# Patient Record
Sex: Female | Born: 1935 | Race: White | Hispanic: No | Smoking: Never smoker
Health system: Southern US, Community
[De-identification: ages and names within clinical notes are randomized; demographics above are authoritative.]

## PROBLEM LIST (undated history)

## (undated) DIAGNOSIS — I495 Sick sinus syndrome: Secondary | ICD-10-CM

## (undated) DIAGNOSIS — I712 Thoracic aortic aneurysm, without rupture: Secondary | ICD-10-CM

## (undated) DIAGNOSIS — I447 Left bundle-branch block, unspecified: Secondary | ICD-10-CM

## (undated) DIAGNOSIS — I351 Nonrheumatic aortic (valve) insufficiency: Secondary | ICD-10-CM

## (undated) DIAGNOSIS — H3530 Unspecified macular degeneration: Secondary | ICD-10-CM

## (undated) DIAGNOSIS — I10 Essential (primary) hypertension: Secondary | ICD-10-CM

## (undated) DIAGNOSIS — S72009A Fracture of unspecified part of neck of unspecified femur, initial encounter for closed fracture: Secondary | ICD-10-CM

## (undated) DIAGNOSIS — I480 Paroxysmal atrial fibrillation: Secondary | ICD-10-CM

## (undated) DIAGNOSIS — E785 Hyperlipidemia, unspecified: Secondary | ICD-10-CM

## (undated) DIAGNOSIS — I2510 Atherosclerotic heart disease of native coronary artery without angina pectoris: Secondary | ICD-10-CM

## (undated) DIAGNOSIS — I7121 Aneurysm of the ascending aorta, without rupture: Secondary | ICD-10-CM

## (undated) DIAGNOSIS — M1990 Unspecified osteoarthritis, unspecified site: Secondary | ICD-10-CM

## (undated) HISTORY — PX: ABDOMINAL HYSTERECTOMY: SHX81

## (undated) HISTORY — PX: APPENDECTOMY: SHX54

## (undated) HISTORY — DX: Paroxysmal atrial fibrillation: I48.0

## (undated) HISTORY — DX: Left bundle-branch block, unspecified: I44.7

## (undated) HISTORY — DX: Essential (primary) hypertension: I10

## (undated) HISTORY — DX: Unspecified macular degeneration: H35.30

## (undated) HISTORY — DX: Nonrheumatic aortic (valve) insufficiency: I35.1

## (undated) HISTORY — DX: Atherosclerotic heart disease of native coronary artery without angina pectoris: I25.10

## (undated) HISTORY — PX: FRACTURE SURGERY: SHX138

## (undated) HISTORY — PX: EYE SURGERY: SHX253

## (undated) HISTORY — DX: Hyperlipidemia, unspecified: E78.5

## (undated) HISTORY — DX: Aneurysm of the ascending aorta, without rupture: I71.21

## (undated) HISTORY — DX: Thoracic aortic aneurysm, without rupture: I71.2

## (undated) HISTORY — DX: Sick sinus syndrome: I49.5

## (undated) HISTORY — DX: Unspecified osteoarthritis, unspecified site: M19.90

## (undated) HISTORY — DX: Fracture of unspecified part of neck of unspecified femur, initial encounter for closed fracture: S72.009A

---

## 1992-11-11 HISTORY — PX: CORONARY ARTERY BYPASS GRAFT: SHX141

## 1997-05-21 ENCOUNTER — Other Ambulatory Visit: Admission: RE | Admit: 1997-05-21 | Discharge: 1997-05-21 | Payer: Self-pay | Admitting: Internal Medicine

## 2001-06-09 ENCOUNTER — Ambulatory Visit (HOSPITAL_COMMUNITY): Admission: RE | Admit: 2001-06-09 | Discharge: 2001-06-09 | Payer: Self-pay | Admitting: Internal Medicine

## 2001-06-09 ENCOUNTER — Encounter: Admission: RE | Admit: 2001-06-09 | Discharge: 2001-06-09 | Payer: Self-pay | Admitting: Internal Medicine

## 2001-06-09 ENCOUNTER — Encounter: Payer: Self-pay | Admitting: Internal Medicine

## 2001-06-28 ENCOUNTER — Encounter: Payer: Self-pay | Admitting: Otolaryngology

## 2001-06-29 ENCOUNTER — Ambulatory Visit (HOSPITAL_COMMUNITY): Admission: RE | Admit: 2001-06-29 | Discharge: 2001-06-29 | Payer: Self-pay | Admitting: Otolaryngology

## 2001-06-29 ENCOUNTER — Encounter: Payer: Self-pay | Admitting: Otolaryngology

## 2002-08-21 HISTORY — PX: CARDIOVASCULAR STRESS TEST: SHX262

## 2004-02-15 ENCOUNTER — Encounter: Admission: RE | Admit: 2004-02-15 | Discharge: 2004-02-15 | Payer: Self-pay | Admitting: Internal Medicine

## 2004-03-31 ENCOUNTER — Encounter: Admission: RE | Admit: 2004-03-31 | Discharge: 2004-03-31 | Payer: Self-pay | Admitting: Internal Medicine

## 2004-04-08 ENCOUNTER — Encounter: Admission: RE | Admit: 2004-04-08 | Discharge: 2004-04-08 | Payer: Self-pay | Admitting: Internal Medicine

## 2004-05-01 ENCOUNTER — Encounter: Admission: RE | Admit: 2004-05-01 | Discharge: 2004-05-01 | Payer: Self-pay | Admitting: Internal Medicine

## 2004-05-25 ENCOUNTER — Encounter: Admission: RE | Admit: 2004-05-25 | Discharge: 2004-05-25 | Payer: Self-pay | Admitting: Internal Medicine

## 2004-05-27 ENCOUNTER — Encounter: Admission: RE | Admit: 2004-05-27 | Discharge: 2004-05-27 | Payer: Self-pay | Admitting: Internal Medicine

## 2004-06-02 ENCOUNTER — Ambulatory Visit: Payer: Self-pay | Admitting: Pulmonary Disease

## 2004-08-21 ENCOUNTER — Encounter: Admission: RE | Admit: 2004-08-21 | Discharge: 2004-08-21 | Payer: Self-pay | Admitting: Internal Medicine

## 2004-12-23 ENCOUNTER — Encounter: Admission: RE | Admit: 2004-12-23 | Discharge: 2004-12-23 | Payer: Self-pay | Admitting: Internal Medicine

## 2006-06-09 HISTORY — PX: US ECHOCARDIOGRAPHY: HXRAD669

## 2006-12-21 ENCOUNTER — Encounter: Admission: RE | Admit: 2006-12-21 | Discharge: 2006-12-21 | Payer: Self-pay | Admitting: Internal Medicine

## 2007-07-14 HISTORY — PX: US ECHOCARDIOGRAPHY: HXRAD669

## 2008-03-12 ENCOUNTER — Ambulatory Visit: Payer: Self-pay | Admitting: Internal Medicine

## 2008-03-27 ENCOUNTER — Ambulatory Visit: Payer: Self-pay | Admitting: Internal Medicine

## 2008-05-14 ENCOUNTER — Ambulatory Visit: Payer: Self-pay | Admitting: Internal Medicine

## 2008-07-18 HISTORY — PX: US ECHOCARDIOGRAPHY: HXRAD669

## 2008-08-13 ENCOUNTER — Ambulatory Visit: Payer: Self-pay | Admitting: Internal Medicine

## 2008-09-25 ENCOUNTER — Ambulatory Visit: Payer: Self-pay | Admitting: Internal Medicine

## 2008-10-11 ENCOUNTER — Ambulatory Visit: Payer: Self-pay | Admitting: Internal Medicine

## 2008-12-24 ENCOUNTER — Ambulatory Visit: Payer: Self-pay | Admitting: Internal Medicine

## 2009-04-29 ENCOUNTER — Inpatient Hospital Stay (HOSPITAL_COMMUNITY): Admission: RE | Admit: 2009-04-29 | Discharge: 2009-05-08 | Payer: Self-pay | Admitting: Orthopedic Surgery

## 2009-05-02 ENCOUNTER — Encounter (INDEPENDENT_AMBULATORY_CARE_PROVIDER_SITE_OTHER): Payer: Self-pay | Admitting: Internal Medicine

## 2009-05-02 HISTORY — PX: TRANSTHORACIC ECHOCARDIOGRAM: SHX275

## 2009-06-11 ENCOUNTER — Ambulatory Visit: Payer: Self-pay | Admitting: Internal Medicine

## 2009-07-08 ENCOUNTER — Ambulatory Visit: Payer: Self-pay | Admitting: Internal Medicine

## 2009-07-08 ENCOUNTER — Encounter: Admission: RE | Admit: 2009-07-08 | Discharge: 2009-07-08 | Payer: Self-pay | Admitting: Internal Medicine

## 2009-07-22 HISTORY — PX: US ECHOCARDIOGRAPHY: HXRAD669

## 2009-08-16 ENCOUNTER — Inpatient Hospital Stay (HOSPITAL_COMMUNITY): Admission: AD | Admit: 2009-08-16 | Discharge: 2009-08-23 | Payer: Self-pay | Admitting: Internal Medicine

## 2009-08-16 ENCOUNTER — Ambulatory Visit: Payer: Self-pay | Admitting: Internal Medicine

## 2009-08-18 ENCOUNTER — Ambulatory Visit: Payer: Self-pay | Admitting: Cardiology

## 2009-08-18 ENCOUNTER — Encounter: Payer: Self-pay | Admitting: Internal Medicine

## 2009-08-18 HISTORY — PX: TRANSTHORACIC ECHOCARDIOGRAM: SHX275

## 2009-08-30 ENCOUNTER — Ambulatory Visit: Payer: Self-pay | Admitting: Internal Medicine

## 2009-09-05 ENCOUNTER — Ambulatory Visit: Payer: Self-pay | Admitting: Internal Medicine

## 2009-09-23 ENCOUNTER — Ambulatory Visit: Payer: Self-pay | Admitting: Internal Medicine

## 2009-12-13 ENCOUNTER — Ambulatory Visit: Payer: Self-pay | Admitting: Internal Medicine

## 2009-12-27 ENCOUNTER — Ambulatory Visit: Payer: Self-pay | Admitting: Oncology

## 2010-01-28 ENCOUNTER — Ambulatory Visit: Payer: Self-pay | Admitting: Cardiology

## 2010-02-02 ENCOUNTER — Encounter: Payer: Self-pay | Admitting: Internal Medicine

## 2010-02-07 ENCOUNTER — Ambulatory Visit: Payer: Self-pay | Admitting: Oncology

## 2010-02-11 LAB — CBC & DIFF AND RETIC
BASO%: 0.8 % (ref 0.0–2.0)
EOS%: 3.3 % (ref 0.0–7.0)
Immature Retic Fract: 1.9 % (ref 0.00–10.70)
MCH: 29.4 pg (ref 25.1–34.0)
MCHC: 31.4 g/dL — ABNORMAL LOW (ref 31.5–36.0)
MONO#: 0.4 10*3/uL (ref 0.1–0.9)
RBC: 3.71 10*6/uL (ref 3.70–5.45)
RDW: 13.4 % (ref 11.2–14.5)
Retic %: 0.6 % (ref 0.50–1.50)
Retic Ct Abs: 22.26 10*3/uL (ref 18.30–72.70)
WBC: 5.2 10*3/uL (ref 3.9–10.3)
lymph#: 1.1 10*3/uL (ref 0.9–3.3)

## 2010-02-11 LAB — MORPHOLOGY

## 2010-02-13 LAB — COMPREHENSIVE METABOLIC PANEL
ALT: 10 U/L (ref 0–35)
AST: 15 U/L (ref 0–37)
Alkaline Phosphatase: 59 U/L (ref 39–117)
BUN: 38 mg/dL — ABNORMAL HIGH (ref 6–23)
Calcium: 9.4 mg/dL (ref 8.4–10.5)
Creatinine, Ser: 0.92 mg/dL (ref 0.40–1.20)
Total Bilirubin: 0.4 mg/dL (ref 0.3–1.2)
Total Protein: 6.7 g/dL (ref 6.0–8.3)

## 2010-02-13 LAB — IMMUNOFIXATION ELECTROPHORESIS
IgA: 221 mg/dL (ref 68–378)
IgM, Serum: 111 mg/dL (ref 60–263)
Total Protein, Serum Electrophoresis: 6.7 g/dL (ref 6.0–8.3)

## 2010-02-13 LAB — TRANSFERRIN RECEPTOR, SOLUABLE: Transferrin Receptor, Soluble: 12.8 nmol/L

## 2010-02-27 ENCOUNTER — Encounter: Payer: Self-pay | Admitting: Internal Medicine

## 2010-02-27 DIAGNOSIS — J069 Acute upper respiratory infection, unspecified: Secondary | ICD-10-CM

## 2010-03-10 ENCOUNTER — Other Ambulatory Visit: Payer: Self-pay | Admitting: Internal Medicine

## 2010-03-10 ENCOUNTER — Ambulatory Visit (INDEPENDENT_AMBULATORY_CARE_PROVIDER_SITE_OTHER): Payer: Medicare Other | Admitting: Internal Medicine

## 2010-03-10 ENCOUNTER — Ambulatory Visit
Admission: RE | Admit: 2010-03-10 | Discharge: 2010-03-10 | Disposition: A | Discharge status: Home / Self Care | Payer: Medicare Other | Source: Ambulatory Visit | Attending: Internal Medicine | Admitting: Internal Medicine

## 2010-03-10 DIAGNOSIS — J189 Pneumonia, unspecified organism: Secondary | ICD-10-CM

## 2010-03-10 DIAGNOSIS — N390 Urinary tract infection, site not specified: Secondary | ICD-10-CM

## 2010-03-10 DIAGNOSIS — R6883 Chills (without fever): Secondary | ICD-10-CM

## 2010-03-10 DIAGNOSIS — D539 Nutritional anemia, unspecified: Secondary | ICD-10-CM

## 2010-03-21 ENCOUNTER — Ambulatory Visit (INDEPENDENT_AMBULATORY_CARE_PROVIDER_SITE_OTHER): Payer: Medicare Other | Admitting: Internal Medicine

## 2010-03-21 DIAGNOSIS — N390 Urinary tract infection, site not specified: Secondary | ICD-10-CM

## 2010-03-24 ENCOUNTER — Ambulatory Visit (INDEPENDENT_AMBULATORY_CARE_PROVIDER_SITE_OTHER): Payer: Medicare Other | Admitting: Internal Medicine

## 2010-03-24 ENCOUNTER — Other Ambulatory Visit: Payer: Self-pay | Admitting: Internal Medicine

## 2010-03-24 ENCOUNTER — Ambulatory Visit
Admission: RE | Admit: 2010-03-24 | Discharge: 2010-03-24 | Disposition: A | Discharge status: Home / Self Care | Payer: Medicare Other | Source: Ambulatory Visit | Attending: Internal Medicine | Admitting: Internal Medicine

## 2010-03-24 DIAGNOSIS — N390 Urinary tract infection, site not specified: Secondary | ICD-10-CM

## 2010-03-24 DIAGNOSIS — J189 Pneumonia, unspecified organism: Secondary | ICD-10-CM

## 2010-03-24 DIAGNOSIS — Z8701 Personal history of pneumonia (recurrent): Secondary | ICD-10-CM

## 2010-03-28 LAB — RETICULOCYTES
Retic Count, Absolute: 20.6 10*3/uL (ref 19.0–186.0)
Retic Ct Pct: 0.6 % (ref 0.4–3.1)

## 2010-03-28 LAB — URINALYSIS, ROUTINE W REFLEX MICROSCOPIC
Bilirubin Urine: NEGATIVE
Glucose, UA: NEGATIVE mg/dL
Nitrite: POSITIVE — AB
Specific Gravity, Urine: 1.014 (ref 1.005–1.030)
pH: 5.5 (ref 5.0–8.0)

## 2010-03-28 LAB — DIFFERENTIAL
Basophils Relative: 0 % (ref 0–1)
Eosinophils Absolute: 0.2 10*3/uL (ref 0.0–0.7)
Eosinophils Relative: 5 % (ref 0–5)
Lymphocytes Relative: 15 % (ref 12–46)
Lymphocytes Relative: 16 % (ref 12–46)
Lymphs Abs: 0.7 10*3/uL (ref 0.7–4.0)
Lymphs Abs: 0.9 10*3/uL (ref 0.7–4.0)
Monocytes Relative: 10 % (ref 3–12)
Monocytes Relative: 2 % — ABNORMAL LOW (ref 3–12)
Neutro Abs: 4.3 10*3/uL (ref 1.7–7.7)
Neutrophils Relative %: 79 % — ABNORMAL HIGH (ref 43–77)

## 2010-03-28 LAB — COMPREHENSIVE METABOLIC PANEL
Albumin: 3.7 g/dL (ref 3.5–5.2)
Alkaline Phosphatase: 78 U/L (ref 39–117)
BUN: 19 mg/dL (ref 6–23)
Calcium: 9.7 mg/dL (ref 8.4–10.5)
Creatinine, Ser: 0.82 mg/dL (ref 0.4–1.2)
Glucose, Bld: 116 mg/dL — ABNORMAL HIGH (ref 70–99)
Total Protein: 6.8 g/dL (ref 6.0–8.3)

## 2010-03-28 LAB — URINE CULTURE: Culture  Setup Time: 201108071220

## 2010-03-28 LAB — URINE MICROSCOPIC-ADD ON

## 2010-03-28 LAB — CBC
HCT: 29 % — ABNORMAL LOW (ref 36.0–46.0)
HCT: 31 % — ABNORMAL LOW (ref 36.0–46.0)
Hemoglobin: 9.7 g/dL — ABNORMAL LOW (ref 12.0–15.0)
MCH: 30.5 pg (ref 26.0–34.0)
MCHC: 33.1 g/dL (ref 30.0–36.0)
MCV: 90.7 fL (ref 78.0–100.0)
MCV: 92.2 fL (ref 78.0–100.0)
Platelets: 209 10*3/uL (ref 150–400)
Platelets: 262 10*3/uL (ref 150–400)
RBC: 3.19 MIL/uL — ABNORMAL LOW (ref 3.87–5.11)
RDW: 14.4 % (ref 11.5–15.5)
WBC: 5 10*3/uL (ref 4.0–10.5)

## 2010-03-28 LAB — FOLATE RBC: RBC Folate: 780 ng/mL — ABNORMAL HIGH (ref 180–600)

## 2010-03-28 LAB — BASIC METABOLIC PANEL
Creatinine, Ser: 0.65 mg/dL (ref 0.4–1.2)
Glucose, Bld: 105 mg/dL — ABNORMAL HIGH (ref 70–99)

## 2010-03-28 LAB — TSH: TSH: 2.331 u[IU]/mL (ref 0.350–4.500)

## 2010-03-28 LAB — IRON AND TIBC: Iron: 30 ug/dL — ABNORMAL LOW (ref 42–135)

## 2010-03-28 LAB — APTT: aPTT: 28 seconds (ref 24–37)

## 2010-03-28 LAB — PROTIME-INR: INR: 1.06 (ref 0.00–1.49)

## 2010-04-01 LAB — CBC
HCT: 26.7 % — ABNORMAL LOW (ref 36.0–46.0)
HCT: 28.3 % — ABNORMAL LOW (ref 36.0–46.0)
HCT: 36 % (ref 36.0–46.0)
Hemoglobin: 10 g/dL — ABNORMAL LOW (ref 12.0–15.0)
MCHC: 32.1 g/dL (ref 30.0–36.0)
MCHC: 32.2 g/dL (ref 30.0–36.0)
MCHC: 32.6 g/dL (ref 30.0–36.0)
MCHC: 32.7 g/dL (ref 30.0–36.0)
MCHC: 32.8 g/dL (ref 30.0–36.0)
MCHC: 32.8 g/dL (ref 30.0–36.0)
MCV: 92.9 fL (ref 78.0–100.0)
MCV: 93.1 fL (ref 78.0–100.0)
MCV: 93.6 fL (ref 78.0–100.0)
MCV: 93.9 fL (ref 78.0–100.0)
MCV: 94.1 fL (ref 78.0–100.0)
Platelets: 177 10*3/uL (ref 150–400)
Platelets: 182 10*3/uL (ref 150–400)
Platelets: 193 10*3/uL (ref 150–400)
Platelets: 225 10*3/uL (ref 150–400)
Platelets: 263 10*3/uL (ref 150–400)
RBC: 2.71 MIL/uL — ABNORMAL LOW (ref 3.87–5.11)
RBC: 2.82 MIL/uL — ABNORMAL LOW (ref 3.87–5.11)
RBC: 2.96 MIL/uL — ABNORMAL LOW (ref 3.87–5.11)
RBC: 3.3 MIL/uL — ABNORMAL LOW (ref 3.87–5.11)
RDW: 13.5 % (ref 11.5–15.5)
RDW: 13.7 % (ref 11.5–15.5)
RDW: 13.9 % (ref 11.5–15.5)
WBC: 11.8 10*3/uL — ABNORMAL HIGH (ref 4.0–10.5)
WBC: 5.8 10*3/uL (ref 4.0–10.5)
WBC: 7.3 10*3/uL (ref 4.0–10.5)

## 2010-04-01 LAB — URINE MICROSCOPIC-ADD ON

## 2010-04-01 LAB — BASIC METABOLIC PANEL
BUN: 22 mg/dL (ref 6–23)
BUN: 22 mg/dL (ref 6–23)
BUN: 23 mg/dL (ref 6–23)
BUN: 26 mg/dL — ABNORMAL HIGH (ref 6–23)
CO2: 25 mEq/L (ref 19–32)
CO2: 29 mEq/L (ref 19–32)
CO2: 29 mEq/L (ref 19–32)
CO2: 32 mEq/L (ref 19–32)
Calcium: 8.1 mg/dL — ABNORMAL LOW (ref 8.4–10.5)
Calcium: 8.2 mg/dL — ABNORMAL LOW (ref 8.4–10.5)
Calcium: 8.4 mg/dL (ref 8.4–10.5)
Chloride: 100 mEq/L (ref 96–112)
Chloride: 103 mEq/L (ref 96–112)
Chloride: 106 mEq/L (ref 96–112)
Creatinine, Ser: 0.77 mg/dL (ref 0.4–1.2)
Creatinine, Ser: 0.77 mg/dL (ref 0.4–1.2)
GFR calc Af Amer: >60 mL/min (ref >60)
GFR calc Af Amer: >60 mL/min (ref >60)
GFR calc Af Amer: >60 mL/min (ref >60)
GFR calc non Af Amer: >60 mL/min (ref >60)
GFR calc non Af Amer: >60 mL/min (ref >60)
GFR calc non Af Amer: >60 mL/min (ref >60)
Glucose, Bld: 83 mg/dL (ref 70–99)
Glucose, Bld: 92 mg/dL (ref 70–99)
Glucose, Bld: 99 mg/dL (ref 70–99)
Potassium: 3.4 mEq/L — ABNORMAL LOW (ref 3.5–5.1)
Potassium: 3.6 mEq/L (ref 3.5–5.1)
Sodium: 138 mEq/L (ref 135–145)

## 2010-04-01 LAB — CARDIAC PANEL(CRET KIN+CKTOT+MB+TROPI)
CK, MB: 2.3 ng/mL (ref 0.3–4.0)
CK, MB: 4.7 ng/mL — ABNORMAL HIGH (ref 0.3–4.0)
Relative Index: 1.5 (ref 0.0–2.5)
Relative Index: 3.1 — ABNORMAL HIGH (ref 0.0–2.5)
Total CK: 130 U/L (ref 7–177)
Total CK: 150 U/L (ref 7–177)
Troponin I: 0.19 ng/mL — ABNORMAL HIGH (ref 0.00–0.06)

## 2010-04-01 LAB — URINALYSIS, MICROSCOPIC ONLY
Bilirubin Urine: NEGATIVE
Glucose, UA: NEGATIVE mg/dL
Ketones, ur: NEGATIVE mg/dL
pH: 5 (ref 5.0–8.0)

## 2010-04-01 LAB — URINALYSIS, ROUTINE W REFLEX MICROSCOPIC
Leukocytes, UA: NEGATIVE
Nitrite: NEGATIVE
Specific Gravity, Urine: 1.013 (ref 1.005–1.030)
Urobilinogen, UA: 0.2 mg/dL (ref 0.0–1.0)
pH: 5 (ref 5.0–8.0)

## 2010-04-01 LAB — BLOOD GAS, ARTERIAL
Bicarbonate: 29.8 mEq/L — ABNORMAL HIGH (ref 20.0–24.0)
O2 Saturation: 96.8 %
Patient temperature: 97.9
TCO2: 28.3 mmol/L (ref 0–100)
pH, Arterial: 7.361 (ref 7.350–7.400)

## 2010-04-01 LAB — APTT: aPTT: 32 seconds (ref 24–37)

## 2010-04-01 LAB — COMPREHENSIVE METABOLIC PANEL
ALT: 12 U/L (ref 0–35)
AST: 20 U/L (ref 0–37)
Albumin: 3 g/dL — ABNORMAL LOW (ref 3.5–5.2)
BUN: 34 mg/dL — ABNORMAL HIGH (ref 6–23)
CO2: 28 mEq/L (ref 19–32)
CO2: 28 mEq/L (ref 19–32)
Calcium: 8.5 mg/dL (ref 8.4–10.5)
Calcium: 8.8 mg/dL (ref 8.4–10.5)
Chloride: 105 mEq/L (ref 96–112)
Creatinine, Ser: 0.98 mg/dL (ref 0.4–1.2)
GFR calc Af Amer: >60 mL/min (ref >60)
GFR calc Af Amer: >60 mL/min (ref >60)
GFR calc non Af Amer: 53 mL/min — ABNORMAL LOW (ref >60)
GFR calc non Af Amer: >60 mL/min (ref >60)
Sodium: 141 mEq/L (ref 135–145)
Total Bilirubin: 1.1 mg/dL (ref 0.3–1.2)

## 2010-04-01 LAB — CROSSMATCH: ABO/RH(D): A POS

## 2010-04-01 LAB — PROTIME-INR: INR: 1.12 (ref 0.00–1.49)

## 2010-04-01 LAB — D-DIMER, QUANTITATIVE: D-Dimer, Quant: 2 ug/mL-FEU — ABNORMAL HIGH (ref 0.00–0.48)

## 2010-04-01 LAB — BRAIN NATRIURETIC PEPTIDE: Pro B Natriuretic peptide (BNP): 502 pg/mL — ABNORMAL HIGH (ref 0.0–100.0)

## 2010-04-01 LAB — URINE CULTURE

## 2010-04-07 ENCOUNTER — Ambulatory Visit (INDEPENDENT_AMBULATORY_CARE_PROVIDER_SITE_OTHER): Payer: Medicare Other | Admitting: Internal Medicine

## 2010-04-07 ENCOUNTER — Ambulatory Visit
Admission: RE | Admit: 2010-04-07 | Discharge: 2010-04-07 | Disposition: A | Discharge status: Home / Self Care | Payer: Medicare Other | Source: Ambulatory Visit | Attending: Internal Medicine | Admitting: Internal Medicine

## 2010-04-07 ENCOUNTER — Other Ambulatory Visit: Payer: Self-pay | Admitting: Internal Medicine

## 2010-04-07 DIAGNOSIS — J189 Pneumonia, unspecified organism: Secondary | ICD-10-CM

## 2010-04-07 DIAGNOSIS — N390 Urinary tract infection, site not specified: Secondary | ICD-10-CM

## 2010-06-11 ENCOUNTER — Other Ambulatory Visit: Payer: Self-pay

## 2010-06-11 MED ORDER — ALPRAZOLAM 0.5 MG PO TABS: 0.5000 mg | ORAL_TABLET | Freq: Two times a day (BID) | ORAL | Status: DC

## 2010-06-13 HISTORY — PX: PACEMAKER INSERTION: SHX728

## 2010-06-17 ENCOUNTER — Encounter: Payer: Self-pay | Admitting: Internal Medicine

## 2010-06-17 ENCOUNTER — Ambulatory Visit (INDEPENDENT_AMBULATORY_CARE_PROVIDER_SITE_OTHER): Payer: Medicare Other | Admitting: Internal Medicine

## 2010-06-17 VITALS — BP 166/72 | HR 84 | Temp 97.7°F | Ht 61.5 in | Wt 102.5 lb

## 2010-06-17 DIAGNOSIS — C44320 Squamous cell carcinoma of skin of unspecified parts of face: Secondary | ICD-10-CM

## 2010-06-17 DIAGNOSIS — M1990 Unspecified osteoarthritis, unspecified site: Secondary | ICD-10-CM

## 2010-06-17 DIAGNOSIS — I2510 Atherosclerotic heart disease of native coronary artery without angina pectoris: Secondary | ICD-10-CM

## 2010-06-17 DIAGNOSIS — I259 Chronic ischemic heart disease, unspecified: Secondary | ICD-10-CM

## 2010-06-17 DIAGNOSIS — I712 Thoracic aortic aneurysm, without rupture: Secondary | ICD-10-CM

## 2010-06-17 DIAGNOSIS — E785 Hyperlipidemia, unspecified: Secondary | ICD-10-CM

## 2010-06-17 DIAGNOSIS — R5381 Other malaise: Secondary | ICD-10-CM

## 2010-06-17 DIAGNOSIS — D509 Iron deficiency anemia, unspecified: Secondary | ICD-10-CM

## 2010-06-17 DIAGNOSIS — H3530 Unspecified macular degeneration: Secondary | ICD-10-CM | POA: Insufficient documentation

## 2010-06-17 DIAGNOSIS — I351 Nonrheumatic aortic (valve) insufficiency: Secondary | ICD-10-CM

## 2010-06-17 DIAGNOSIS — N390 Urinary tract infection, site not specified: Secondary | ICD-10-CM

## 2010-06-17 DIAGNOSIS — M810 Age-related osteoporosis without current pathological fracture: Secondary | ICD-10-CM | POA: Insufficient documentation

## 2010-06-17 DIAGNOSIS — E559 Vitamin D deficiency, unspecified: Secondary | ICD-10-CM

## 2010-06-17 DIAGNOSIS — D649 Anemia, unspecified: Secondary | ICD-10-CM

## 2010-06-17 DIAGNOSIS — I10 Essential (primary) hypertension: Secondary | ICD-10-CM

## 2010-06-17 DIAGNOSIS — H7100 Cholesteatoma of attic, unspecified ear: Secondary | ICD-10-CM

## 2010-06-17 DIAGNOSIS — Z0000 Encounter for general adult medical examination without abnormal findings: Secondary | ICD-10-CM

## 2010-06-17 DIAGNOSIS — I359 Nonrheumatic aortic valve disorder, unspecified: Secondary | ICD-10-CM

## 2010-06-17 DIAGNOSIS — H7101 Cholesteatoma of attic, right ear: Secondary | ICD-10-CM

## 2010-06-17 LAB — POCT URINALYSIS DIPSTICK
Bilirubin, UA: NEGATIVE
Glucose, UA: NEGATIVE
Nitrite, UA: POSITIVE
pH, UA: 5

## 2010-06-17 LAB — CBC WITH DIFFERENTIAL/PLATELET
Basophils Absolute: 0 10*3/uL (ref 0.0–0.1)
Basophils Relative: 0 % (ref 0–1)
Hemoglobin: 10.2 g/dL — ABNORMAL LOW (ref 12.0–15.0)
MCHC: 31 g/dL (ref 30.0–36.0)
Monocytes Relative: 5 % (ref 3–12)
Neutro Abs: 3.5 10*3/uL (ref 1.7–7.7)
Neutrophils Relative %: 68 % (ref 43–77)
RDW: 14.7 % (ref 11.5–15.5)

## 2010-06-17 NOTE — Patient Instructions (Signed)
Take medications as directed. Your urine has been cultured and results are pending. Increase Vicodin to twice a day instead of daily for pain control. See [place] again in 6 months for fasting lab work and office visit.

## 2010-06-17 NOTE — Progress Notes (Signed)
  Subjective:    Patient ID: Felicia [name], female    DOB: Nov 29, 1912, 77 y.o.   MRN: [id number]  HPI patient in today for evaluation of multiple medical problems including coronary artery disease status post PTCA  1994 with a 95% LAD lesion and an 80% segmental lesion. History of hypertension, hyperlipidemia, osteoarthritis, osteoporosis, macular degeneration righ teye recurrent urinary tract infections, moderate aortic insufficiency, vitamin D deficiency, cholesteatoma right ear, chronic anemia, ascending aortic thoracic aneurysm not amenable to surgery, status post left hip hemi arthroplasty. Patient had pneumonia in February 2020 [place]. 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 Also followed by Dr. Lenoria Farrier in [facility], otolaryngologist, and Dr. Patty Sermons, cardiologist. Patient had Pneumovax immunization in 2001 gets yearly influenza immunization, last tetanus immunization 1995. Last mammogram 2004. Last Pap smear 2001.    Review of Systems chronic back and leg pain, difficulty with vision particularly right eye, ambulates with a walker. Lives alone in [place] . Family lives nearby. Does not smoke or drink alcohol. Does not  dip snuff. She is a widow. Homemaker. Has  visiting nurse check all her every 3 months and someone from [employer] to do light housework one or 2 days a week. Recent workup for anemia lab hematologist January 2012. It was decided not to be aggressive. Could be anemia of chronic disease. Hematologist did not feel we needed to pursue colonoscopy. Hemoglobin has been stable. Appetite fair. Hospitalized August 2011 with acute L1 compression fracture. Sulfa has caused nausea in the past. Patient indicated in the past that hydrocodone caused this but she is able to tolerate Vicodin for musculoskeletal pain just fine.     Objective:   Physical Exam neck supple, no JVD, no thyromegaly,. Chest clear to auscultation. Cardiac exam regular rate and rhythm normal S1  and S2. Extremities without edema. Neuro: She is alert and oriented to person place and time. Ambulates well with a walker.        Assessment & Plan:  1-hypertension stable on current regimen  2-coronary artery disease stable   3-descending aortic aneurysm-not amenable to surgery at her age and she understands that he is  4-recurrent urinary tract infections-urinalysis today abnormal and urine culture obtained  5 osteoarthritis treated with Vicodin 5/500 daily. Increase to 1 tablet twice daily.  6-moderate aortic insufficiency-stable  7-Vitamin D deficiency-treated with over-the-counter vitamin D supplement   8-Cholesteatoma right ear-patient has appointment to see Dr. Lenoria Farrier soon  9-anemia likely of chronic disease worked up by hematologist January 2000 [place]. CBC drawn and is pending today   10 -- hyperlipidemia currently on lovastatin 20 mg daily fasting lipid panel liver functions drawn today.  Plan patient is doing exceptionally well for her age. It is remarkable she continues to live a line. Return in 6 months for brief checkup, lipid panel liver functions and CBC , and basic metabolic panel.

## 2010-06-18 LAB — LIPID PANEL
HDL: 51 mg/dL (ref >39)
LDL Cholesterol: 96 mg/dL (ref 0–99)
Triglycerides: 80 mg/dL (ref <150)
VLDL: 16 mg/dL (ref 0–40)

## 2010-06-18 LAB — COMPREHENSIVE METABOLIC PANEL
ALT: 8 U/L (ref 0–35)
AST: 14 U/L (ref 0–37)
Albumin: 4.1 g/dL (ref 3.5–5.2)
Alkaline Phosphatase: 44 U/L (ref 39–117)
Potassium: 5.2 mEq/L (ref 3.5–5.3)
Sodium: 138 mEq/L (ref 135–145)
Total Protein: 6.9 g/dL (ref 6.0–8.3)

## 2010-06-18 LAB — TSH: TSH: 1.786 u[IU]/mL (ref 0.350–4.500)

## 2010-06-20 ENCOUNTER — Other Ambulatory Visit: Payer: Self-pay

## 2010-06-20 ENCOUNTER — Encounter: Payer: Self-pay | Admitting: Cardiology

## 2010-06-20 ENCOUNTER — Telehealth: Payer: Self-pay

## 2010-06-20 LAB — URINE CULTURE

## 2010-06-20 MED ORDER — CIPROFLOXACIN HCL 250 MG PO TABS: 250.0000 mg | ORAL_TABLET | Freq: Two times a day (BID) | ORAL | Status: AC

## 2010-06-20 NOTE — Telephone Encounter (Signed)
Phone call from [facility] nurse manager Verlon Au, at patients home today. Patient states she has been having chest discomfort since yesterday. Happens mostly on inspiration.Burping a lot. Advised nurse she should be evaluated at ED, or at least an UrgentCare. Also patient has UTI on Culture results. Per Dr. Lenord Fellers, Cipro 250 mg 1 po bid x 10 days sent to [facility] in [facility] [address] [facility]

## 2010-06-27 ENCOUNTER — Telehealth: Payer: Self-pay | Admitting: Cardiology

## 2010-06-27 NOTE — Telephone Encounter (Signed)
Spoke with Felicia [name] and advised patient needed see doctor who put in pacemaker per Dr. Patty Sermons.  She will and call back next week to schedule appointment

## 2010-06-27 NOTE — Telephone Encounter (Signed)
Patient is due to see [place] in July.  She has recently had a pacer implant and was told to see her doctor within a week or so.  Patient is also experiencing swelling in one of her hands and would like to speak with RN about possibly getting worked in.

## 2010-06-30 ENCOUNTER — Other Ambulatory Visit: Payer: Self-pay | Admitting: Internal Medicine

## 2010-06-30 DIAGNOSIS — I2510 Atherosclerotic heart disease of native coronary artery without angina pectoris: Secondary | ICD-10-CM

## 2010-07-07 ENCOUNTER — Other Ambulatory Visit: Payer: Self-pay | Admitting: Internal Medicine

## 2010-07-07 DIAGNOSIS — E785 Hyperlipidemia, unspecified: Secondary | ICD-10-CM

## 2010-07-28 ENCOUNTER — Ambulatory Visit (INDEPENDENT_AMBULATORY_CARE_PROVIDER_SITE_OTHER): Payer: Medicare Other | Admitting: Cardiology

## 2010-07-28 ENCOUNTER — Encounter: Payer: Self-pay | Admitting: Cardiology

## 2010-07-28 DIAGNOSIS — Z950 Presence of cardiac pacemaker: Secondary | ICD-10-CM | POA: Insufficient documentation

## 2010-07-28 DIAGNOSIS — I359 Nonrheumatic aortic valve disorder, unspecified: Secondary | ICD-10-CM

## 2010-07-28 DIAGNOSIS — I351 Nonrheumatic aortic (valve) insufficiency: Secondary | ICD-10-CM

## 2010-07-28 NOTE — Assessment & Plan Note (Signed)
The patient has a past history of known left bundle-branch block.  Her last EKG here was 01/24/10 which showed normal sinus rhythm with a left bundle-branch block pattern.  The patient was doing well until June 23, 2010 When she was airlifted by helicopter from her home to [facility] because of chest pain.  Several days later on 06/26/10 she underwent insertion of a cardiac pacemaker.  We don't have any of the details of that admission or what the underlying rhythm was at the time of the pacemaker implantation.  She is known pre-existing left bundle-branch block.  She states that her symptoms were chest tightness and discomfort rather than syncope.  We don't have the records from [facility] at this point to learning more about her situation.  She has felt much better since the pacemaker.  She's not having any chest pain.

## 2010-07-28 NOTE — Assessment & Plan Note (Signed)
The patient has a past history of known aortic valve disease with aortic insufficiency.  She has not had any exacerbation of congestive heart failure.  She's not having any increasing dyspnea and her previous symptoms of chest pain have resolved with the placement of the permanent pacemaker.

## 2010-07-28 NOTE — Progress Notes (Signed)
Felicia [name] Date of Birth:  July 10, 1912 [facility] [address]   [address] [place], [place]  [address] [phone]           Fax   [phone]  HPI: This pleasant 89 showing is seen for a six-month followup office visit.  She had a history of known chronic left bundle-branch block.  She has a history of ischemic heart disease and has had prior PTCA to the LAD in 1994.  She's had an ascending aortic aneurysm first noted in 2003 and elected not to have surgery.  She has a known murmur of aortic insufficiency.  In addition she has a history of essential hypertension, hypercholesterolemia, vertigo, and arthritis.  She had a hip fracture in 2001.  On June 11 she developed chest discomfort and was airlifted by helicopter to [facility] where 3 days later on June 14 she underwent placement of a pacemaker.  She denies any history of dizziness or syncope.  Since the pacemaker she has a felt better overall and her stamina has improved.  Her last nuclear stress test was in 2004 and her last echocardiogram was 08/18/09 at which time she had normal ejection fraction of 60-65% with moderate LVH and with diastolic dysfunction grade 1.  Her aortic valve at that time showed mild to moderate regurgitation and the aorta itself was dilated with the proximal aorta measuring 5.38 cm.  She did have pulmonary artery hypertension with a PA pressure of 54.  Current Outpatient Prescriptions  Medication Sig Dispense Refill  . ALPRAZolam (XANAX) 0.5 MG tablet Take 1 tablet (0.5 mg total) by mouth 2 (two) times daily. 1/2 to 1 tablet bid  180 tablet  5  . aspirin 325 MG tablet Take 81 mg by mouth daily.       Marland Kitchen HYDROcodone-acetaminophen (VICODIN) 5-500 MG per tablet Take 1 tablet by mouth every 6 (six) hours as needed.        . isosorbide mononitrate (IMDUR) 60 MG 24 hr tablet TAKE (1/2) TABLET DAILY.  45 tablet  3  . lisinopril (PRINIVIL,ZESTRIL) 10 MG tablet Take 10 mg by mouth daily.          Marland Kitchen lovastatin (MEVACOR) 20 MG tablet TAKE 1 TABLET ONCE DAILY  90 tablet  3  . metoprolol (LOPRESSOR) 50 MG tablet Take 50 mg by mouth daily.        . Multiple Vitamin (MULTIVITAMIN) capsule Take 1 capsule by mouth daily.        . Multiple Vitamins-Minerals (OCUVITE ADULT 50+ PO) Take by mouth 2 (two) times daily at 10 AM and 5 PM.          Allergies  Allergen Reactions  . Hydrocodone Hives  . Sulfa Antibiotics Nausea Only    Patient Active Problem List  Diagnoses  . HTN (hypertension)  . Coronary disease  . Other and unspecified hyperlipidemia  . Cholesteatoma of attic of right ear  . Frequent UTI  . Macular degeneration  . Osteoporosis  . Osteoarthritis  . Aortic insufficiency  . Squamous cell carcinoma of face  . Anemia  . Aneurysm of ascending aorta  . Cardiac pacemaker    History  Smoking status  . Never Smoker   Smokeless tobacco  . Never Used    History  Alcohol Use No    No family history on file.  Review of Systems: The patient denies any heat or cold intolerance.  No weight gain or weight loss.  The patient  denies headaches or blurry vision.  There is no cough or sputum production.  The patient denies dizziness.  There is no hematuria or hematochezia.  The patient denies any muscle aches or arthritis.  The patient denies any rash.  The patient denies frequent falling or instability.  There is no history of depression or anxiety.  All other systems were reviewed and are negative.   Physical Exam: Filed Vitals:   07/28/10 1522  BP: 140/60  Pulse: 70  The general appearance feels an elderly woman in no acute distress.  She appears to be slightly pale.  The skin is warm and dry.Pupils equal and reactive.   Extraocular Movements are full.  There is no scleral icterus.  The mouth and pharynx are normal.  The neck is supple.  The carotids reveal no bruits.  The jugular venous pressure is normal.  The thyroid is not enlarged.  There is no lymphadenopathy.The  chest is clear to percussion and auscultation. There are no rales or rhonchi. Expansion of the chest is symmetrical.  The pacemaker site looks fine.  The heart reveals a soft murmur of aortic insufficiency along the sternal edge.  There is no gallop.The abdomen is soft and nontender. Bowel sounds are normal. The liver and spleen are not enlarged. There Are no abdominal masses. There are no bruits.  Extremities reveal mild 1+ left ankle edema which is secondary to her left hip fracture a year ago.  Strength is normal and symmetrical in all extremities.  There is no lateralizing weakness.  There are no sensory deficits.The skin is warm and dry.  There is no rash.She does have some old ecchymosis of the left arm at the site of the previous intravenous fluid administration    Assessment / Plan:  The patient is to continue same medication and be rechecked in 6 months for a followup office visit and EKG.  She reports that her pacemaker will be followed down at [facility].

## 2010-08-21 ENCOUNTER — Other Ambulatory Visit: Payer: Self-pay | Admitting: *Deleted

## 2010-08-21 MED ORDER — METOPROLOL TARTRATE 25 MG PO TABS: ORAL_TABLET | ORAL | Status: DC

## 2010-08-28 ENCOUNTER — Telehealth: Payer: Self-pay | Admitting: Internal Medicine

## 2010-08-28 NOTE — Telephone Encounter (Signed)
Pt advised she needed to be seen.  Scheduled for an appt Friday, Aug 17 at 9:45

## 2010-08-29 ENCOUNTER — Ambulatory Visit (INDEPENDENT_AMBULATORY_CARE_PROVIDER_SITE_OTHER): Payer: Medicare Other | Admitting: Internal Medicine

## 2010-08-29 ENCOUNTER — Encounter: Payer: Self-pay | Admitting: Internal Medicine

## 2010-08-29 VITALS — BP 150/76 | Temp 97.6°F | Wt 100.0 lb

## 2010-08-29 DIAGNOSIS — N390 Urinary tract infection, site not specified: Secondary | ICD-10-CM

## 2010-08-29 DIAGNOSIS — D649 Anemia, unspecified: Secondary | ICD-10-CM

## 2010-08-29 DIAGNOSIS — R300 Dysuria: Secondary | ICD-10-CM

## 2010-08-29 LAB — POCT URINALYSIS DIPSTICK
Bilirubin, UA: NEGATIVE
Ketones, UA: NEGATIVE
Protein, UA: NEGATIVE
Spec Grav, UA: 1.02
pH, UA: 5

## 2010-08-29 MED ORDER — CEFTRIAXONE SODIUM 1 G IJ SOLR
1.0000 g | INTRAMUSCULAR | Status: AC
  Administered 2010-08-29: 1 g via INTRAMUSCULAR

## 2010-08-29 NOTE — Patient Instructions (Signed)
Take Macrobid 50 mg twice daily for 10 days. Return in 2 weeks.

## 2010-08-29 NOTE — Progress Notes (Signed)
Addended by: Judy Pimple on: 08/29/2010 11:52 AM   Modules accepted: Orders

## 2010-08-29 NOTE — Progress Notes (Signed)
  Subjective:    Patient ID: Felicia [name], female    DOB: Dec 21, 1912, 77 y.o.   MRN: [id number]  HPI  77 year old white female with history of hypertension, hyperlipidemia, coronary artery disease, tachybradycardia syndrome status post pacemaker insertion, macular degeneration, cholesteatoma right ear, aortic insufficiency in today with urinary tract infection symptoms for several days. No fever or shaking chills. History of osteoporosis with compression fractures. History of resistance to Levaquin/ Cipro with urine infections.     Review of Systems     Objective:   Physical Exam no CVA tenderness. Chest clear to auscultation; cardiac exam regular rate and rhythm normal S1 and S2; extremities without edema; alert and oriented x3        Assessment & Plan:  Urinary tract infection-abnormal urinalysis. Culture is pending. Plan Macrobid 50 mg twice daily for 10 days with plans to recheck urine in 2 weeks. 1 g IM Rocephin given.

## 2010-08-30 LAB — CBC WITH DIFFERENTIAL/PLATELET
Basophils Absolute: 0 10*3/uL (ref 0.0–0.1)
Basophils Relative: 1 % (ref 0–1)
Hemoglobin: 9.8 g/dL — ABNORMAL LOW (ref 12.0–15.0)
MCHC: 29.5 g/dL — ABNORMAL LOW (ref 30.0–36.0)
Neutro Abs: 3.4 10*3/uL (ref 1.7–7.7)
Neutrophils Relative %: 66 % (ref 43–77)
Platelets: 206 10*3/uL (ref 150–400)
RDW: 14.5 % (ref 11.5–15.5)

## 2010-08-31 LAB — URINE CULTURE: Colony Count: >=100000

## 2010-09-04 ENCOUNTER — Other Ambulatory Visit: Payer: Self-pay | Admitting: *Deleted

## 2010-09-04 MED ORDER — LISINOPRIL 20 MG PO TABS: 20.0000 mg | ORAL_TABLET | Freq: Every day | ORAL | Status: DC

## 2010-09-12 ENCOUNTER — Encounter: Payer: Self-pay | Admitting: Internal Medicine

## 2010-09-12 ENCOUNTER — Ambulatory Visit (INDEPENDENT_AMBULATORY_CARE_PROVIDER_SITE_OTHER): Payer: Medicare Other | Admitting: Internal Medicine

## 2010-09-12 VITALS — Temp 98.2°F

## 2010-09-12 DIAGNOSIS — N390 Urinary tract infection, site not specified: Secondary | ICD-10-CM

## 2010-09-12 LAB — POCT URINALYSIS DIPSTICK
Protein, UA: NEGATIVE
Urobilinogen, UA: NEGATIVE
pH, UA: 6

## 2010-09-12 NOTE — Progress Notes (Signed)
  Subjective:    Patient ID: Felicia [name], female    DOB: 12-13-1912, 77 y.o.   MRN: [id number]  HPI    Review of Systems     Objective:   Physical Exam        Assessment & Plan:

## 2010-09-12 NOTE — Progress Notes (Signed)
  Subjective:    Patient ID: Felicia [name], female    DOB: Jan 19, 1912, 77 y.o.   MRN: [id number]  HPI patient in today to followup on recent treatment for urinary tract infection. She was here 08/29/2010 complaining of UTI symptoms. Was placed on Macrobid 50 mg twice daily for 10 days and was given 1 g IM Rocephin. Culture grew greater than 100,000 multiple species. Patient now feeling better and is asymptomatic. Has trace LE on urinalysis today.    Review of Systems     Objective:   Physical Exam        Assessment & Plan:  Status post treatment for UTI. Patient now asymptomatic. Has trace LE on urinalysis. Will not initiate any further treatment at this point in time. Will not reculture urine today.

## 2010-09-30 ENCOUNTER — Encounter (INDEPENDENT_AMBULATORY_CARE_PROVIDER_SITE_OTHER): Payer: Medicare Other | Admitting: Ophthalmology

## 2010-10-03 ENCOUNTER — Telehealth: Payer: Self-pay | Admitting: Internal Medicine

## 2010-10-03 NOTE — Telephone Encounter (Signed)
Called Felicia [name] Thursday, 10/02/10 in the afternoon.  Pt stated she had a cold that seemed to be going into her chest.  Advised her that her call had been received at Dr. Beryle Quant home and to please direct all calls to the office in the future.  Advised patient that Dr. Lenord Fellers was not in the office and to go to Urgent Care or schedule an appt for Friday.  Patient stated she would try to manage symptoms on her own and call back if not any better.

## 2010-10-15 ENCOUNTER — Encounter (INDEPENDENT_AMBULATORY_CARE_PROVIDER_SITE_OTHER): Payer: Medicare Other | Admitting: Ophthalmology

## 2010-10-15 DIAGNOSIS — H2700 Aphakia, unspecified eye: Secondary | ICD-10-CM

## 2010-10-15 DIAGNOSIS — H35039 Hypertensive retinopathy, unspecified eye: Secondary | ICD-10-CM

## 2010-10-15 DIAGNOSIS — H43819 Vitreous degeneration, unspecified eye: Secondary | ICD-10-CM

## 2010-10-15 DIAGNOSIS — H3530 Unspecified macular degeneration: Secondary | ICD-10-CM

## 2010-12-18 ENCOUNTER — Encounter: Payer: Self-pay | Admitting: Internal Medicine

## 2010-12-18 ENCOUNTER — Ambulatory Visit (INDEPENDENT_AMBULATORY_CARE_PROVIDER_SITE_OTHER): Payer: Medicare Other | Admitting: Internal Medicine

## 2010-12-18 DIAGNOSIS — I712 Thoracic aortic aneurysm, without rupture, unspecified: Secondary | ICD-10-CM

## 2010-12-18 DIAGNOSIS — D649 Anemia, unspecified: Secondary | ICD-10-CM

## 2010-12-18 DIAGNOSIS — M545 Low back pain, unspecified: Secondary | ICD-10-CM

## 2010-12-18 DIAGNOSIS — I10 Essential (primary) hypertension: Secondary | ICD-10-CM

## 2010-12-18 DIAGNOSIS — Z79899 Other long term (current) drug therapy: Secondary | ICD-10-CM

## 2010-12-18 DIAGNOSIS — N390 Urinary tract infection, site not specified: Secondary | ICD-10-CM

## 2010-12-18 DIAGNOSIS — E785 Hyperlipidemia, unspecified: Secondary | ICD-10-CM

## 2010-12-18 DIAGNOSIS — Z8781 Personal history of (healed) traumatic fracture: Secondary | ICD-10-CM

## 2010-12-18 DIAGNOSIS — R300 Dysuria: Secondary | ICD-10-CM

## 2010-12-18 LAB — POCT URINALYSIS DIPSTICK
Nitrite, UA: POSITIVE
pH, UA: 6

## 2010-12-19 LAB — CBC WITH DIFFERENTIAL/PLATELET
Basophils Absolute: 0 10*3/uL (ref 0.0–0.1)
Eosinophils Relative: 2 % (ref 0–5)
HCT: 34.5 % — ABNORMAL LOW (ref 36.0–46.0)
Hemoglobin: 10.4 g/dL — ABNORMAL LOW (ref 12.0–15.0)
Lymphocytes Relative: 27 % (ref 12–46)
Lymphs Abs: 1.5 10*3/uL (ref 0.7–4.0)
MCV: 94 fL (ref 78.0–100.0)
Monocytes Absolute: 0.3 10*3/uL (ref 0.1–1.0)
Monocytes Relative: 6 % (ref 3–12)
Neutro Abs: 3.5 10*3/uL (ref 1.7–7.7)
RDW: 14.7 % (ref 11.5–15.5)
WBC: 5.5 10*3/uL (ref 4.0–10.5)

## 2010-12-19 LAB — HEPATIC FUNCTION PANEL
AST: 16 U/L (ref 0–37)
Albumin: 3.8 g/dL (ref 3.5–5.2)
Bilirubin, Direct: 0.2 mg/dL (ref 0.0–0.3)
Total Bilirubin: 0.6 mg/dL (ref 0.3–1.2)

## 2010-12-19 LAB — LIPID PANEL
LDL Cholesterol: 95 mg/dL (ref 0–99)
Total CHOL/HDL Ratio: 2.8 Ratio

## 2010-12-21 LAB — URINE CULTURE: Colony Count: >=100000

## 2010-12-22 ENCOUNTER — Other Ambulatory Visit: Payer: Self-pay

## 2010-12-22 MED ORDER — CIPROFLOXACIN HCL 250 MG PO TABS: 250.0000 mg | ORAL_TABLET | Freq: Two times a day (BID) | ORAL | Status: AC

## 2011-01-12 ENCOUNTER — Encounter: Payer: Self-pay | Admitting: Internal Medicine

## 2011-01-12 DIAGNOSIS — Z8781 Personal history of (healed) traumatic fracture: Secondary | ICD-10-CM | POA: Insufficient documentation

## 2011-01-12 NOTE — Progress Notes (Signed)
  Subjective:    Patient ID: Felicia [name], female    DOB: 16-Mar-1912, 77 y.o.   MRN: [id number]  HPI 77 year old white female lives independently with family assistance in today for six-month followup. History of vertebral compression fracture, history of recurrent urinary tract infections, history of aneurysm of  thoracic aorta which has been deemed inoperable, history of hypertension, history of cholesteatoma, history of anemia, history of coronary artery disease. Basically has been doing well recently. Is in good spirits today. Has some lumbar back pain for which she takes Vicodin about twice a day.    Review of Systems     Objective:   Physical Exam neck is supple without JVD; chest clear to auscultation; cardiac exam regular rate and rhythm; extremities without edema        Assessment & Plan:  Hypertension  Hyperlipidemia  History of recurrent urinary tract infections-culture pending  History of anemia   History of cholesteatoma  History of thoracic aortic aneurysm-inoperable  History of coronary artery disease  Plan: Return in 6 months for followup and fasting lab work.

## 2011-01-12 NOTE — Patient Instructions (Signed)
Continue same medications. Return in 6 months 

## 2011-01-23 ENCOUNTER — Encounter: Payer: Self-pay | Admitting: Cardiology

## 2011-01-26 ENCOUNTER — Ambulatory Visit (INDEPENDENT_AMBULATORY_CARE_PROVIDER_SITE_OTHER): Payer: Medicare Other | Admitting: Cardiology

## 2011-01-26 ENCOUNTER — Encounter: Payer: Self-pay | Admitting: Cardiology

## 2011-01-26 VITALS — BP 130/78 | HR 66 | Ht 66.0 in | Wt 104.0 lb

## 2011-01-26 DIAGNOSIS — I4891 Unspecified atrial fibrillation: Secondary | ICD-10-CM

## 2011-01-26 DIAGNOSIS — Z950 Presence of cardiac pacemaker: Secondary | ICD-10-CM

## 2011-01-26 DIAGNOSIS — I480 Paroxysmal atrial fibrillation: Secondary | ICD-10-CM

## 2011-01-26 DIAGNOSIS — I712 Thoracic aortic aneurysm, without rupture, unspecified: Secondary | ICD-10-CM

## 2011-01-26 DIAGNOSIS — I447 Left bundle-branch block, unspecified: Secondary | ICD-10-CM

## 2011-01-26 DIAGNOSIS — I482 Chronic atrial fibrillation, unspecified: Secondary | ICD-10-CM | POA: Insufficient documentation

## 2011-01-26 DIAGNOSIS — I10 Essential (primary) hypertension: Secondary | ICD-10-CM

## 2011-01-26 DIAGNOSIS — E7800 Pure hypercholesterolemia, unspecified: Secondary | ICD-10-CM

## 2011-01-26 NOTE — Patient Instructions (Signed)
Ok to use an extra Metoprolol as needed when you have sudden onset of fast heart rate Your physician recommends that you continue on your current medications as directed. Please refer to the Current Medication list given to you today. Your physician wants you to follow-up in: 6 months  You will receive a reminder letter in the mail two months in advance. If you don't receive a letter, please call our office to schedule the follow-up appointment.

## 2011-01-26 NOTE — Progress Notes (Signed)
Randa Spike Date of Birth:  Aug 10, 1912 [facility] [address] [place], [place]  [address] [phone]         Fax   [phone]  History of Present Illness: This pleasant 78 year old woman is seen for a six-month followup office visit.  Her appointment date had been moved up because of an episode of paroxysmal atrial fibrillation last week.  She was in her usual state of health last Wednesday evening when she suddenly noticed that her heart was racing.  She went to the emergency room at [facility] and was given intravenous medication with conversion back to normal sinus rhythm.  She does have an underlying pacemaker which was implanted for bradycardia on 06/23/10 at [facility].  She has not been experiencing any chest pain or angina.  He does have a history of ischemic heart disease and had prior angioplasty to the LAD in 1994.  She has a known descending aortic aneurysm and a known murmur of aortic insufficiency.  After her visit to the emergency room in [place], no new medications were added to her regimen.  Current Outpatient Prescriptions  Medication Sig Dispense Refill  . ALPRAZolam (XANAX) 0.5 MG tablet Take 1 tablet (0.5 mg total) by mouth 2 (two) times daily. 1/2 to 1 tablet bid  180 tablet  5  . aspirin 325 MG tablet Take 81 mg by mouth daily.       . cyanocobalamin 100 MCG tablet Take 100 mcg by mouth daily.        . ferrous sulfate 325 (65 FE) MG tablet Take 325 mg by mouth daily with breakfast.        . HYDROcodone-acetaminophen (VICODIN) 5-500 MG per tablet Take 1 tablet by mouth every 6 (six) hours as needed.        . isosorbide mononitrate (IMDUR) 60 MG 24 hr tablet TAKE (1/2) TABLET DAILY.  45 tablet  3  . lisinopril (PRINIVIL,ZESTRIL) 20 MG tablet Take 1 tablet (20 mg total) by mouth daily.  30 tablet  5  . lovastatin (MEVACOR) 20 MG tablet TAKE 1 TABLET ONCE DAILY  90 tablet  3  . metoprolol tartrate (LOPRESSOR) 25 MG tablet Take 1/2 tablet po  BID  30 tablet  PRN  . Multiple Vitamin (MULTIVITAMIN) capsule Take 1 capsule by mouth daily.        . Multiple Vitamins-Minerals (OCUVITE ADULT 50+ PO) Take by mouth 2 (two) times daily at 10 AM and 5 PM.          Allergies  Allergen Reactions  . Hydrocodone Hives  . Sulfa Antibiotics Nausea Only    Patient Active Problem List  Diagnoses  . HTN (hypertension)  . Coronary disease  . Other and unspecified hyperlipidemia  . Cholesteatoma of attic of right ear  . Frequent UTI  . Macular degeneration  . Osteoporosis  . Osteoarthritis  . Aortic insufficiency  . Squamous cell carcinoma of face  . Anemia  . Aneurysm of ascending aorta  . Cardiac pacemaker  . History of compression fracture of vertebral column    History  Smoking status  . Never Smoker   Smokeless tobacco  . Never Used    History  Alcohol Use No    Family History  Problem Relation Age of Onset  . Heart attack Son     Review of Systems: Constitutional: no fever chills diaphoresis or fatigue or change in weight.  Head and neck: no hearing loss, no epistaxis, no photophobia  or visual disturbance. Respiratory: No cough, shortness of breath or wheezing. Cardiovascular: No chest pain peripheral edema, palpitations. Gastrointestinal: No abdominal distention, no abdominal pain, no change in bowel habits hematochezia or melena. Genitourinary: No dysuria, no frequency, no urgency, no nocturia. Musculoskeletal:No arthralgias, no back pain, no gait disturbance or myalgias. Neurological: No dizziness, no headaches, no numbness, no seizures, no syncope, no weakness, no tremors. Hematologic: No lymphadenopathy, no easy bruising. Psychiatric: No confusion, no hallucinations, no sleep disturbance.    Physical Exam: Filed Vitals:   01/26/11 1214  BP: 130/78  Pulse: 66   The general appearance reveals a well-developed well-nourished elderly woman in no distress.  She has marked kyphosis.Pupils equal and  reactive.   Extraocular Movements are full.  There is no scleral icterus.  The mouth and pharynx are normal.  The neck is supple.  The carotids reveal no bruits.  The jugular venous pressure is normal.  The thyroid is not enlarged.  There is no lymphadenopathy.  The chest is clear.  The heart reveals a grade 2/6 systolic ejection murmur at the base radiating to the neck.  There is a grade 2/6 decrescendo murmur of aortic insufficiency at the left sternal edge.  No gallop or rub.The abdomen is soft and nontender. Bowel sounds are normal. The liver and spleen are not enlarged. There Are no abdominal masses. There are no bruits.  The pedal pulses are good.  There is no phlebitis or edema.  There is no cyanosis or clubbing. Strength is normal and symmetrical in all extremities.  There is no lateralizing weakness.  There are no sensory deficits.  EKG today shows normal sinus rhythm and left bundle-branch block which is old  Assessment / Plan: Continue same medication.  There is extra metoprolol if necessary for sudden fast heart rate.  Recheck 6 months for followup office visit and EKG

## 2011-01-26 NOTE — Assessment & Plan Note (Signed)
Her aneurysm of the ascending aorta is being managed medically with control of blood pressure.  She is not a operative candidate.

## 2011-01-26 NOTE — Assessment & Plan Note (Signed)
She is unsteady on her feet and walks with a walker.  She is not a candidate for Coumadin.  She will remain on her daily aspirin.  If she has a recurrent episode of paroxysmal atrial fibrillation she may take an extra metoprolol tablet and see if he and will slow down.  She does have an underlying pacemaker in place.  If she fails to improve quickly then she should call 911 and go back to the emergency room for intravenous medication again.  She will avoid excess caffeine.

## 2011-01-26 NOTE — Assessment & Plan Note (Signed)
Blood pressure has been remaining stable on current medication. 

## 2011-02-04 ENCOUNTER — Ambulatory Visit: Payer: PRIVATE HEALTH INSURANCE | Admitting: Cardiology

## 2011-03-23 ENCOUNTER — Other Ambulatory Visit: Payer: Self-pay | Admitting: Internal Medicine

## 2011-03-30 ENCOUNTER — Other Ambulatory Visit: Payer: Self-pay

## 2011-03-30 ENCOUNTER — Other Ambulatory Visit: Payer: Self-pay | Admitting: Internal Medicine

## 2011-03-30 MED ORDER — LISINOPRIL 20 MG PO TABS: 20.0000 mg | ORAL_TABLET | Freq: Every day | ORAL | Status: DC

## 2011-05-26 ENCOUNTER — Other Ambulatory Visit: Payer: Self-pay | Admitting: Internal Medicine

## 2011-05-26 ENCOUNTER — Other Ambulatory Visit: Payer: Self-pay

## 2011-05-26 MED ORDER — ALPRAZOLAM 0.5 MG PO TABS: 0.5000 mg | ORAL_TABLET | Freq: Two times a day (BID) | ORAL | Status: DC | PRN

## 2011-05-26 NOTE — Telephone Encounter (Signed)
Refill for 6 months. 

## 2011-05-27 ENCOUNTER — Other Ambulatory Visit: Payer: Self-pay | Admitting: Internal Medicine

## 2011-05-28 ENCOUNTER — Other Ambulatory Visit: Payer: Self-pay | Admitting: Internal Medicine

## 2011-06-25 ENCOUNTER — Ambulatory Visit (INDEPENDENT_AMBULATORY_CARE_PROVIDER_SITE_OTHER): Payer: Medicare Other | Admitting: Internal Medicine

## 2011-06-25 ENCOUNTER — Encounter: Payer: Self-pay | Admitting: Internal Medicine

## 2011-06-25 VITALS — BP 166/72 | HR 72 | Temp 98.2°F | Ht 62.0 in | Wt 103.0 lb

## 2011-06-25 DIAGNOSIS — D649 Anemia, unspecified: Secondary | ICD-10-CM

## 2011-06-25 DIAGNOSIS — Z8781 Personal history of (healed) traumatic fracture: Secondary | ICD-10-CM

## 2011-06-25 DIAGNOSIS — N390 Urinary tract infection, site not specified: Secondary | ICD-10-CM

## 2011-06-25 DIAGNOSIS — H7190 Unspecified cholesteatoma, unspecified ear: Secondary | ICD-10-CM

## 2011-06-25 DIAGNOSIS — Z950 Presence of cardiac pacemaker: Secondary | ICD-10-CM

## 2011-06-25 DIAGNOSIS — M810 Age-related osteoporosis without current pathological fracture: Secondary | ICD-10-CM

## 2011-06-25 DIAGNOSIS — E785 Hyperlipidemia, unspecified: Secondary | ICD-10-CM

## 2011-06-25 DIAGNOSIS — I712 Thoracic aortic aneurysm, without rupture: Secondary | ICD-10-CM

## 2011-06-25 DIAGNOSIS — H7191 Unspecified cholesteatoma, right ear: Secondary | ICD-10-CM

## 2011-06-25 DIAGNOSIS — I2510 Atherosclerotic heart disease of native coronary artery without angina pectoris: Secondary | ICD-10-CM

## 2011-06-25 DIAGNOSIS — I10 Essential (primary) hypertension: Secondary | ICD-10-CM

## 2011-06-25 LAB — POCT URINALYSIS DIPSTICK
Glucose, UA: NEGATIVE
Nitrite, UA: NEGATIVE
Urobilinogen, UA: NEGATIVE

## 2011-06-25 LAB — COMPREHENSIVE METABOLIC PANEL
ALT: <8 U/L (ref 0–35)
AST: 14 U/L (ref 0–37)
Albumin: 3.8 g/dL (ref 3.5–5.2)
BUN: 36 mg/dL — ABNORMAL HIGH (ref 6–23)
CO2: 31 mEq/L (ref 19–32)
Calcium: 9.5 mg/dL (ref 8.4–10.5)
Chloride: 103 mEq/L (ref 96–112)
Potassium: 5.3 mEq/L (ref 3.5–5.3)

## 2011-06-25 LAB — LIPID PANEL: Cholesterol: 165 mg/dL (ref 0–200)

## 2011-06-25 LAB — CBC WITH DIFFERENTIAL/PLATELET
Basophils Absolute: 0 10*3/uL (ref 0.0–0.1)
Eosinophils Relative: 2 % (ref 0–5)
HCT: 34.4 % — ABNORMAL LOW (ref 36.0–46.0)
Lymphocytes Relative: 27 % (ref 12–46)
Lymphs Abs: 1.5 10*3/uL (ref 0.7–4.0)
MCV: 90.3 fL (ref 78.0–100.0)
Monocytes Absolute: 0.2 10*3/uL (ref 0.1–1.0)
Neutro Abs: 3.9 10*3/uL (ref 1.7–7.7)
RBC: 3.81 MIL/uL — ABNORMAL LOW (ref 3.87–5.11)
RDW: 15.1 % (ref 11.5–15.5)
WBC: 5.8 10*3/uL (ref 4.0–10.5)

## 2011-06-25 LAB — TSH: TSH: 2.216 u[IU]/mL (ref 0.350–4.500)

## 2011-06-27 LAB — URINE CULTURE: Colony Count: >=100000

## 2011-07-12 ENCOUNTER — Encounter: Payer: Self-pay | Admitting: Internal Medicine

## 2011-07-12 NOTE — Patient Instructions (Addendum)
Take Cipro for urinary tract infection. Return in 6 months. Continue same medications. Take Vicodin sparingly for pain

## 2011-07-12 NOTE — Progress Notes (Signed)
  Subjective:    Patient ID: Felicia [name], female    DOB: April 16, 1912, 78 y.o.   MRN: [id number]  HPI 78 year old white female in today for recheck appointment. She is frail and ambulates with a walker.  She is alert and oriented and does quite well living alone. Does have some home health assistance. She has a history of recurrent urinary tract infections. Urinalysis today is abnormal and she will be placed on Cipro. History of coronary artery disease status post PTCA 1994. Left lower lobe pneumonia February 2012. Macular degeneration right eye. Aortic insufficiency. Thoracic aortic aneurysm which has been deemed inoperable by cardiovascular surgeon due to age and risk. History of cholesteatoma right ear seen at [facility]. History of hyperlipidemia, hypertension. History of osteoporosis and compression fracture in back. History of pacemaker. Followed by Dr. Patty Sermons.    Review of Systems     Objective:   Physical Exam she is alert and oriented x3. HEENT exam: Pharynx is clear; abnormal right TM consistent with cholesteatoma; neck is supple without JVD or thyromegaly. Chest is clear to auscultation; cardiac exam regular rate and rhythm. Extremities without edema. Neuro no gross focal deficits on brief neurological exam. Ambulates slowly with a walker.        Assessment & Plan:  Hypertension-stable on current regimen-stable on current regimen  Coronary artery disease-status post PTCA 1994 followed by cardiologist  Hyperlipidemia  Recurrent urinary tract infections-abnormal urinalysis today. Treat with Cipro.  Thoracic aortic aneurysm-deemed non-operable due to age and risk  Cholesteatoma right ear followed by Dr. Lenoria Farrier @ [facility]  Pacemaker  Osteoporosis  History of compression fracture  History of anemia of chronic disease  Plan: Patient takes Vicodin sparingly on daily basis for musculoskeletal pain. This was refilled. She is return in 6 months. Okay to refill  medications for 6 months if pharmacy contacts [place]

## 2011-07-22 ENCOUNTER — Other Ambulatory Visit: Payer: Self-pay | Admitting: Internal Medicine

## 2011-07-22 NOTE — Telephone Encounter (Signed)
Spoke with pharmacist at [facility] and clarified dose of Drisdol 1.25mg  should be once a week, not twice a week. He will let patient know this

## 2011-08-31 ENCOUNTER — Other Ambulatory Visit: Payer: Self-pay | Admitting: Internal Medicine

## 2011-10-21 ENCOUNTER — Encounter (INDEPENDENT_AMBULATORY_CARE_PROVIDER_SITE_OTHER): Payer: Medicare Other | Admitting: Ophthalmology

## 2011-11-03 ENCOUNTER — Other Ambulatory Visit: Payer: Self-pay | Admitting: Internal Medicine

## 2011-11-12 ENCOUNTER — Encounter (INDEPENDENT_AMBULATORY_CARE_PROVIDER_SITE_OTHER): Payer: Medicare Other | Admitting: Ophthalmology

## 2011-11-12 DIAGNOSIS — H3530 Unspecified macular degeneration: Secondary | ICD-10-CM

## 2011-11-12 DIAGNOSIS — H35039 Hypertensive retinopathy, unspecified eye: Secondary | ICD-10-CM

## 2011-11-12 DIAGNOSIS — I10 Essential (primary) hypertension: Secondary | ICD-10-CM

## 2011-11-12 DIAGNOSIS — H43819 Vitreous degeneration, unspecified eye: Secondary | ICD-10-CM

## 2011-12-24 ENCOUNTER — Other Ambulatory Visit: Payer: Self-pay | Admitting: Internal Medicine

## 2011-12-24 ENCOUNTER — Ambulatory Visit (INDEPENDENT_AMBULATORY_CARE_PROVIDER_SITE_OTHER): Payer: Medicare Other | Admitting: Internal Medicine

## 2011-12-24 ENCOUNTER — Encounter: Payer: Self-pay | Admitting: Internal Medicine

## 2011-12-24 VITALS — BP 150/58 | HR 68 | Temp 97.8°F | Wt 100.0 lb

## 2011-12-24 DIAGNOSIS — N390 Urinary tract infection, site not specified: Secondary | ICD-10-CM

## 2011-12-24 DIAGNOSIS — F419 Anxiety disorder, unspecified: Secondary | ICD-10-CM

## 2011-12-24 DIAGNOSIS — I2510 Atherosclerotic heart disease of native coronary artery without angina pectoris: Secondary | ICD-10-CM

## 2011-12-24 DIAGNOSIS — M549 Dorsalgia, unspecified: Secondary | ICD-10-CM

## 2011-12-24 DIAGNOSIS — H7191 Unspecified cholesteatoma, right ear: Secondary | ICD-10-CM

## 2011-12-24 DIAGNOSIS — Z79899 Other long term (current) drug therapy: Secondary | ICD-10-CM

## 2011-12-24 DIAGNOSIS — H7190 Unspecified cholesteatoma, unspecified ear: Secondary | ICD-10-CM

## 2011-12-24 DIAGNOSIS — Z8781 Personal history of (healed) traumatic fracture: Secondary | ICD-10-CM

## 2011-12-24 DIAGNOSIS — D509 Iron deficiency anemia, unspecified: Secondary | ICD-10-CM

## 2011-12-24 DIAGNOSIS — E785 Hyperlipidemia, unspecified: Secondary | ICD-10-CM

## 2011-12-24 DIAGNOSIS — M810 Age-related osteoporosis without current pathological fracture: Secondary | ICD-10-CM

## 2011-12-24 DIAGNOSIS — G8929 Other chronic pain: Secondary | ICD-10-CM

## 2011-12-24 DIAGNOSIS — F411 Generalized anxiety disorder: Secondary | ICD-10-CM

## 2011-12-24 DIAGNOSIS — I259 Chronic ischemic heart disease, unspecified: Secondary | ICD-10-CM

## 2011-12-24 DIAGNOSIS — I10 Essential (primary) hypertension: Secondary | ICD-10-CM

## 2011-12-24 LAB — CBC WITH DIFFERENTIAL/PLATELET
Basophils Absolute: 0 10*3/uL (ref 0.0–0.1)
Basophils Relative: 0 % (ref 0–1)
Eosinophils Absolute: 0.1 10*3/uL (ref 0.0–0.7)
Eosinophils Relative: 2 % (ref 0–5)
HCT: 33.4 % — ABNORMAL LOW (ref 36.0–46.0)
Hemoglobin: 10.6 g/dL — ABNORMAL LOW (ref 12.0–15.0)
Lymphocytes Relative: 19 % (ref 12–46)
Lymphs Abs: 1 10*3/uL (ref 0.7–4.0)
MCH: 28.8 pg (ref 26.0–34.0)
MCHC: 31.7 g/dL (ref 30.0–36.0)
MCV: 90.8 fL (ref 78.0–100.0)
Monocytes Absolute: 0.3 10*3/uL (ref 0.1–1.0)
Monocytes Relative: 6 % (ref 3–12)
Neutro Abs: 3.8 10*3/uL (ref 1.7–7.7)
Neutrophils Relative %: 73 % (ref 43–77)
Platelets: 211 10*3/uL (ref 150–400)
RBC: 3.68 MIL/uL — ABNORMAL LOW (ref 3.87–5.11)
RDW: 14.5 % (ref 11.5–15.5)
WBC: 5.3 10*3/uL (ref 4.0–10.5)

## 2011-12-24 LAB — POCT URINALYSIS DIPSTICK
Nitrite, UA: NEGATIVE
pH, UA: 6

## 2011-12-24 NOTE — Progress Notes (Signed)
  Subjective:    Patient ID: Felicia [name], female    DOB: 07-23-1912, 78 y.o.   MRN: [id number]  HPI 78 year old White female for 6 month recheck. Had flu vaccine.  Has someone from [employer] help her 3 days a week. Helps with housework and changes linens. Son does grocery shopping. Lives alone and son lives near by. No falls. Not depressed. Seems to have chronic UTI. Urinalysis today is abnormal. Culture is pending. Amazingly her mind is quite good. Has chronic back pain and history of compression fracture. Takes Vicodin for back pain. Takes Xanax for anxiety. History of hyperlipidemia and has been on Zocor for many years. Also followed by Dr. Patty Sermons for coronary disease. History of hypertension. History of cholesteatoma followed by Dr. Lenoria Farrier at [facility] History of thoracic aortic aneurysm thought not to be operable due to age and medical condition. History of iron deficiency anemia. History of pacemaker.    Review of Systems     Objective:   Physical Exam skin is warm and dry. Nodes none. Chest clear to auscultation. Cardiac exam regular rate and rhythm normal S1 and S2. Extremities without edema. Alert and oriented x3.        Assessment & Plan:  UTI-recurrent and probably chronic  Hypertension  Hyperlipidemia  Coronary artery disease  Cholesteatoma right ear  Chronic back pain secondary to compression fracture  Anxiety  Osteoporosis  Ascending thoracic aortic aneurysm  History of pacemaker  History of iron deficiency anemia  Plan: Return in 6 months. Continue same medications. Patient wants prescription for Cipro for UTI however sometimes urine culture reveals organism resistant to Cipro.  Addendum: 12/27/2011 urine culture shows Enterococcus species not sensitive to Cipro. Changed to amoxicillin 500 mg 3 times daily for 10 days.

## 2011-12-25 LAB — LIPID PANEL
Cholesterol: 166 mg/dL (ref 0–200)
Total CHOL/HDL Ratio: 2.5 Ratio
VLDL: 15 mg/dL (ref 0–40)

## 2011-12-25 LAB — CMP AND LIVER
ALT: 10 U/L (ref 0–35)
AST: 16 U/L (ref 0–37)
Alkaline Phosphatase: 53 U/L (ref 39–117)
Creat: 0.77 mg/dL (ref 0.50–1.10)
Sodium: 138 mEq/L (ref 135–145)
Total Bilirubin: 0.5 mg/dL (ref 0.3–1.2)
Total Protein: 6.8 g/dL (ref 6.0–8.3)

## 2011-12-26 LAB — URINE CULTURE: Colony Count: >=100000

## 2011-12-28 ENCOUNTER — Other Ambulatory Visit: Payer: Self-pay

## 2011-12-28 MED ORDER — AMOXICILLIN 500 MG PO CAPS: 500.0000 mg | ORAL_CAPSULE | Freq: Three times a day (TID) | ORAL | Status: DC

## 2011-12-29 ENCOUNTER — Ambulatory Visit: Payer: Medicare Other | Admitting: Internal Medicine

## 2012-01-16 NOTE — Patient Instructions (Addendum)
I would suggest you await urine culture results before starting Cipro. Sometimes urine infection is resistant to Cipro. Continue same medications and return in 6 months for physical exam.

## 2012-03-28 ENCOUNTER — Telehealth: Payer: Self-pay | Admitting: Cardiology

## 2012-03-28 NOTE — Telephone Encounter (Signed)
New Problem:    Patient's grandaughter Called in wanting to know if her mother could have her device checked at our office.  Please call back.

## 2012-03-28 NOTE — Telephone Encounter (Signed)
Has been getting checked at [facility]. Daughter wanted for [place] to check pacemaker at 04/14/12 ov. Checked with Belenda Cruise and ok to check if daughter can get type of pacemaker. She will call back with that information.

## 2012-03-28 NOTE — Telephone Encounter (Signed)
Follow up from previous visit    Pacemaker is [employer] brand

## 2012-03-29 NOTE — Telephone Encounter (Signed)
Will forward to device clinic  

## 2012-03-30 ENCOUNTER — Encounter: Payer: Self-pay | Admitting: Cardiology

## 2012-04-04 ENCOUNTER — Telehealth: Payer: Self-pay | Admitting: Cardiology

## 2012-04-04 ENCOUNTER — Other Ambulatory Visit: Payer: Self-pay | Admitting: Internal Medicine

## 2012-04-04 NOTE — Telephone Encounter (Signed)
Plz return call to Benay Spice daughter [phone]  Regarding possible device ck at next [facility] appointment, even though the device is monitored by patients [facility] appointment

## 2012-04-04 NOTE — Telephone Encounter (Signed)
Will be able to check pacer at appointment, advised daughter

## 2012-04-14 ENCOUNTER — Ambulatory Visit (INDEPENDENT_AMBULATORY_CARE_PROVIDER_SITE_OTHER): Payer: Medicare Other | Admitting: Cardiology

## 2012-04-14 ENCOUNTER — Encounter: Payer: Self-pay | Admitting: Internal Medicine

## 2012-04-14 ENCOUNTER — Encounter: Payer: Self-pay | Admitting: Cardiology

## 2012-04-14 VITALS — BP 132/70 | HR 65 | Ht 66.0 in | Wt 105.8 lb

## 2012-04-14 DIAGNOSIS — Z950 Presence of cardiac pacemaker: Secondary | ICD-10-CM

## 2012-04-14 DIAGNOSIS — I351 Nonrheumatic aortic (valve) insufficiency: Secondary | ICD-10-CM

## 2012-04-14 DIAGNOSIS — I480 Paroxysmal atrial fibrillation: Secondary | ICD-10-CM

## 2012-04-14 DIAGNOSIS — I4891 Unspecified atrial fibrillation: Secondary | ICD-10-CM

## 2012-04-14 DIAGNOSIS — I712 Thoracic aortic aneurysm, without rupture, unspecified: Secondary | ICD-10-CM

## 2012-04-14 DIAGNOSIS — I359 Nonrheumatic aortic valve disorder, unspecified: Secondary | ICD-10-CM

## 2012-04-14 LAB — PACEMAKER DEVICE OBSERVATION
DEVICE MODEL PM: 166745
RV LEAD AMPLITUDE: 12 mv
RV LEAD IMPEDENCE PM: 560 Ohm
RV LEAD THRESHOLD: 0.7 V

## 2012-04-14 NOTE — Assessment & Plan Note (Signed)
Interrogation of the pacemaker today shows that she is 100% sensed and 0% paced.  Her 12-lead echocardiogram today shows normal sinus rhythm and left bundle branch block rate of 65

## 2012-04-14 NOTE — Assessment & Plan Note (Signed)
Interrogation of her pacemaker today shows that she has had no intercurrent episodes of atrial fibrillation

## 2012-04-14 NOTE — Assessment & Plan Note (Signed)
The patient has a history of aortic insufficiency.  She has mild dyspnea occasionally.  She is not having any symptoms of CHF.  She does have mild ankle edema.  She is on lisinopril.  She has not required diuretic therapy.

## 2012-04-14 NOTE — Patient Instructions (Addendum)
Your physician recommends that you continue on your current medications as directed. Please refer to the Current Medication list given to you today.  Your physician wants you to follow-up in: 8 month You will receive a reminder letter in the mail two months in advance. If you don't receive a letter, please call our office to schedule the follow-up appointment.

## 2012-04-14 NOTE — Progress Notes (Signed)
Felicia [name] Date of Birth:  1912-12-30 [facility] [address] [place], [place]  [address] [phone]         Fax   [phone]  History of Present Illness: This pleasant 79 year old woman is seen for a six-month followup office visit.  She has a remote history of paroxysmal atrial fibrillation. She does have an underlying pacemaker which was implanted for bradycardia on 06/23/10 at [facility]. She has not been experiencing any chest pain or angina.  She does have a history of ischemic heart disease and had prior angioplasty to the LAD in 1994. She has a known descending aortic aneurysm and a known murmur of aortic insufficiency.   Current Outpatient Prescriptions  Medication Sig Dispense Refill  . ALPRAZolam (XANAX) 0.5 MG tablet TAKE 1/2 TO 1 TABLET TWICE DAILY  180 tablet  2  . aspirin EC 81 MG tablet Take 81 mg by mouth daily.      . ferrous sulfate 325 (65 FE) MG tablet Take 325 mg by mouth daily with breakfast.        . HYDROcodone-acetaminophen (NORCO/VICODIN) 5-325 MG per tablet Take 1 tablet by mouth every 6 (six) hours as needed for pain.      . isosorbide mononitrate (IMDUR) 60 MG 24 hr tablet TAKE 1/2 TABLET ONCE DAILY  45 tablet  11  . lisinopril (PRINIVIL,ZESTRIL) 20 MG tablet TAKE ONE TABLET BY MOUTH ONCE DAILY  90 tablet  0  . lovastatin (MEVACOR) 20 MG tablet TAKE ONE TABLET BY MOUTH ONCE DAILY  30 tablet  11  . metoprolol tartrate (LOPRESSOR) 25 MG tablet TAKE 1/2 TABLET BY MOUTH TWICE DAILY  90 tablet  3  . Multiple Vitamin (MULTIVITAMIN) capsule Take 1 capsule by mouth daily.        . Multiple Vitamins-Minerals (OCUVITE ADULT 50+ PO) Take by mouth 2 (two) times daily at 10 AM and 5 PM.        . Vitamin D, Ergocalciferol, (DRISDOL) 50000 UNITS CAPS TAKE ONE CAPSULE BY MOUTH ONCE A WEEK FOR 12 WEEKS.  8 capsule  0   No current facility-administered medications for this visit.    Allergies  Allergen Reactions  . Hydrocodone Hives  . Sulfa  Antibiotics Nausea Only    Patient Active Problem List  Diagnosis  . HTN (hypertension)  . Coronary disease  . Other and unspecified hyperlipidemia  . Cholesteatoma of attic of right ear  . Frequent UTI  . Macular degeneration  . Osteoporosis  . Osteoarthritis  . Aortic insufficiency  . Squamous cell carcinoma of face  . Anemia  . Aneurysm of ascending aorta  . Cardiac pacemaker  . History of compression fracture of vertebral column  . PAF (paroxysmal atrial fibrillation)    History  Smoking status  . Never Smoker   Smokeless tobacco  . Never Used    History  Alcohol Use No    Family History  Problem Relation Age of Onset  . Heart attack Son     Review of Systems: Constitutional: no fever chills diaphoresis or fatigue or change in weight.  Head and neck: no hearing loss, no epistaxis, no photophobia or visual disturbance. Respiratory: No cough, shortness of breath or wheezing. Cardiovascular: No chest pain peripheral edema, palpitations. Gastrointestinal: No abdominal distention, no abdominal pain, no change in bowel habits hematochezia or melena. Genitourinary: No dysuria, no frequency, no urgency, no nocturia. Musculoskeletal:No arthralgias, no back pain, no gait disturbance or myalgias. Neurological: No dizziness, no headaches,  no numbness, no seizures, no syncope, no weakness, no tremors. Hematologic: No lymphadenopathy, no easy bruising. Psychiatric: No confusion, no hallucinations, no sleep disturbance.    Physical Exam: Filed Vitals:   04/14/12 1153  BP: 132/70  Pulse: 65   the general appearance reveals a thin elderly woman in no acute distress.The head and neck exam reveals pupils equal and reactive.  Extraocular movements are full.  There is no scleral icterus.  The mouth and pharynx are normal.  The neck is supple.  The carotids reveal no bruits.  The jugular venous pressure is normal.  The  thyroid is not enlarged.  There is no lymphadenopathy.   The chest is clear to percussion and auscultation.  There are no rales or rhonchi.  Expansion of the chest is symmetrical.  The pacemaker is in the left upper chest.  The pacing catheter wire he is quite prominent on to her thin skin there is no evidence of erosion or infection. The precordium is quiet.  The first heart sound is normal.  The second heart sound is physiologically split.  There is no gallop rub or click.  There is a grade 2/6 decrescendo murmur of aortic insufficiency along the left sternal edge. There is no abnormal lift or heave.  The abdomen is soft and nontender.  The bowel sounds are normal.  The liver and spleen are not enlarged.  There are no abdominal masses.  There are no abdominal bruits.  Extremities reveal good pedal pulses.  There is no phlebitis and there is 2+ ankle edema  There is no cyanosis or clubbing.  Strength is normal and symmetrical in all extremities.  There is no lateralizing weakness.  There are no sensory deficits.  The skin is warm and dry.  There is no rash.  EKG shows normal sinus rhythm left axis deviation left bundle branch block and a normal PR interval of 144 ms.  No paced beats are seen   Assessment / Plan: The patient is doing well.  Continue same medications.  She continues to be able to live by herself in [place].  She has a home health aide who comes in 3 days a week to help her with taking a shower etc. she also gets Meals on Wheels.  She uses Xanax for palpitations or occasional chest pain and this seems to work better than sublingual nitroglycerin for her. She has a past history of anemia and her blood work is followed by Dr. Willette Alma here in about 8 months for followup office visit.  She will have a pacemaker clinic followup in about 6 months.

## 2012-04-14 NOTE — Progress Notes (Signed)
PPM check/device clinic. Seeing Dr. Patty Sermons today. Normal VVI PPM function. See PaceArt report.

## 2012-04-14 NOTE — Assessment & Plan Note (Signed)
The patient has a known large ascending aortic aneurysm.  She is not an operative candidate because of age and comorbidities.  She is not having chest pain.

## 2012-04-28 DIAGNOSIS — D509 Iron deficiency anemia, unspecified: Secondary | ICD-10-CM

## 2012-04-28 DIAGNOSIS — M549 Dorsalgia, unspecified: Secondary | ICD-10-CM

## 2012-04-28 DIAGNOSIS — G8929 Other chronic pain: Secondary | ICD-10-CM

## 2012-04-28 DIAGNOSIS — F411 Generalized anxiety disorder: Secondary | ICD-10-CM

## 2012-04-28 DIAGNOSIS — M810 Age-related osteoporosis without current pathological fracture: Secondary | ICD-10-CM

## 2012-05-30 ENCOUNTER — Other Ambulatory Visit: Payer: Self-pay | Admitting: Internal Medicine

## 2012-06-15 ENCOUNTER — Other Ambulatory Visit: Payer: Self-pay | Admitting: Internal Medicine

## 2012-06-28 ENCOUNTER — Other Ambulatory Visit: Payer: Self-pay | Admitting: Internal Medicine

## 2012-06-28 ENCOUNTER — Encounter: Payer: Self-pay | Admitting: Internal Medicine

## 2012-06-28 ENCOUNTER — Ambulatory Visit (INDEPENDENT_AMBULATORY_CARE_PROVIDER_SITE_OTHER): Payer: Medicare Other | Admitting: Internal Medicine

## 2012-06-28 VITALS — BP 142/64 | HR 68 | Temp 97.1°F | Wt 102.0 lb

## 2012-06-28 DIAGNOSIS — E785 Hyperlipidemia, unspecified: Secondary | ICD-10-CM

## 2012-06-28 DIAGNOSIS — H7191 Unspecified cholesteatoma, right ear: Secondary | ICD-10-CM

## 2012-06-28 DIAGNOSIS — I712 Thoracic aortic aneurysm, without rupture: Secondary | ICD-10-CM

## 2012-06-28 DIAGNOSIS — N390 Urinary tract infection, site not specified: Secondary | ICD-10-CM

## 2012-06-28 DIAGNOSIS — R5381 Other malaise: Secondary | ICD-10-CM

## 2012-06-28 DIAGNOSIS — G8929 Other chronic pain: Secondary | ICD-10-CM

## 2012-06-28 DIAGNOSIS — Z8679 Personal history of other diseases of the circulatory system: Secondary | ICD-10-CM

## 2012-06-28 DIAGNOSIS — I519 Heart disease, unspecified: Secondary | ICD-10-CM

## 2012-06-28 DIAGNOSIS — R5383 Other fatigue: Secondary | ICD-10-CM

## 2012-06-28 DIAGNOSIS — D638 Anemia in other chronic diseases classified elsewhere: Secondary | ICD-10-CM

## 2012-06-28 DIAGNOSIS — M549 Dorsalgia, unspecified: Secondary | ICD-10-CM

## 2012-06-28 DIAGNOSIS — Z950 Presence of cardiac pacemaker: Secondary | ICD-10-CM

## 2012-06-28 DIAGNOSIS — I10 Essential (primary) hypertension: Secondary | ICD-10-CM

## 2012-06-28 DIAGNOSIS — H7190 Unspecified cholesteatoma, unspecified ear: Secondary | ICD-10-CM

## 2012-06-28 DIAGNOSIS — Z23 Encounter for immunization: Secondary | ICD-10-CM

## 2012-06-28 LAB — CBC WITH DIFFERENTIAL/PLATELET
HCT: 31.7 % — ABNORMAL LOW (ref 36.0–46.0)
Hemoglobin: 9.9 g/dL — ABNORMAL LOW (ref 12.0–15.0)
Lymphocytes Relative: 22 % (ref 12–46)
Lymphs Abs: 0.9 10*3/uL (ref 0.7–4.0)
MCHC: 31.2 g/dL (ref 30.0–36.0)
Monocytes Absolute: 0.3 10*3/uL (ref 0.1–1.0)
Monocytes Relative: 7 % (ref 3–12)
Neutro Abs: 3 10*3/uL (ref 1.7–7.7)
RBC: 3.59 MIL/uL — ABNORMAL LOW (ref 3.87–5.11)
WBC: 4.3 10*3/uL (ref 4.0–10.5)

## 2012-06-28 LAB — POCT URINALYSIS DIPSTICK
Bilirubin, UA: NEGATIVE
Blood, UA: NEGATIVE
Nitrite, UA: POSITIVE
Spec Grav, UA: 1.015
pH, UA: 6

## 2012-06-28 LAB — COMPREHENSIVE METABOLIC PANEL
Albumin: 3.9 g/dL (ref 3.5–5.2)
BUN: 30 mg/dL — ABNORMAL HIGH (ref 6–23)
CO2: 28 mEq/L (ref 19–32)
Calcium: 9.5 mg/dL (ref 8.4–10.5)
Chloride: 102 mEq/L (ref 96–112)
Glucose, Bld: 97 mg/dL (ref 70–99)
Potassium: 5 mEq/L (ref 3.5–5.3)
Sodium: 136 mEq/L (ref 135–145)
Total Protein: 6.7 g/dL (ref 6.0–8.3)

## 2012-06-28 MED ORDER — TETANUS-DIPHTH-ACELL PERTUSSIS 5-2.5-18.5 LF-MCG/0.5 IM SUSP: 0.5000 mL | Freq: Once | INTRAMUSCULAR | Status: DC

## 2012-06-28 NOTE — Patient Instructions (Addendum)
Continue same medications and return in 6 months. For urinary infection, take Cipro 250 mg twice daily for 10 days. Lab work is pending.

## 2012-06-28 NOTE — Telephone Encounter (Signed)
Please refill hydrocodone APAP for 6 months and lisinopril for one year

## 2012-07-02 LAB — URINE CULTURE

## 2012-07-05 ENCOUNTER — Other Ambulatory Visit: Payer: Self-pay

## 2012-07-05 MED ORDER — CIPROFLOXACIN HCL 250 MG PO TABS: 250.0000 mg | ORAL_TABLET | Freq: Two times a day (BID) | ORAL | Status: DC

## 2012-07-05 NOTE — Progress Notes (Signed)
Patient informed. She is requesting another round of antibiotics to make sure infection is gone . OK per Dr. Lenord Fellers for Cipro 250mg  po bid x 10 days

## 2012-07-20 ENCOUNTER — Other Ambulatory Visit: Payer: Self-pay | Admitting: Internal Medicine

## 2012-07-24 NOTE — Progress Notes (Signed)
  Subjective:    Patient ID: Felicia [name], female    DOB: March 12, 1912, 79 y.o.   MRN: [id number]  HPI  79 year old female with multiple problems and doing amazingly well for age. She is alert and oriented and resides alone with help from near by family members. Has history of chronic recurrent urinary tract infections. Urinalysis today is abnormal. Will be placed on Cipro and culture obtained. History of hyperlipidemia and hypertension. History of cholesteatoma followed by Dr. Lenoria Farrier at [facility]. History of thoracic aortic aneurysm felt not to be operable due to age and medical condition. History of iron deficiency anemia and anemia of chronic disease. History of pacemaker. History of vertebral compression fractures. History of coronary artery disease. No recent falls and is not depressed    Review of Systems     Objective:   Physical Exam Skin is warm and dry. Alert and oriented. Chest clear to auscultation without rales or wheezing. Cardiac exam regular rate and rhythm. Extremities without edema.        Assessment & Plan:  History of iron deficiency anemia and anemia of chronic disease. Continue with iron therapy.  Hypertension-stable on lisinopril and metoprolol  Hyperlipidemia-stable on Mevacor  History of vertebral compression fractures and chronic back pain treated with Vicodin  Chronic recurrent urinary tract infections. Treat with Cipro today. Urine culture pending.  History of macular degeneration  History of pacemaker  History of thoracic ascending aortic aneurysm-not operable due to age and medical condition  Plan: Return in 6 months for physical examination  Time spent with patient  and her granddaughter who is a nurse: 25 minutes

## 2012-09-15 ENCOUNTER — Ambulatory Visit (INDEPENDENT_AMBULATORY_CARE_PROVIDER_SITE_OTHER): Payer: Medicare Other | Admitting: Internal Medicine

## 2012-09-15 ENCOUNTER — Encounter: Payer: Self-pay | Admitting: Internal Medicine

## 2012-09-15 ENCOUNTER — Ambulatory Visit
Admission: RE | Admit: 2012-09-15 | Discharge: 2012-09-15 | Disposition: A | Discharge status: Home / Self Care | Payer: Medicare Other | Source: Ambulatory Visit | Attending: Internal Medicine | Admitting: Internal Medicine

## 2012-09-15 VITALS — BP 140/62 | HR 72 | Temp 97.7°F | Wt 99.0 lb

## 2012-09-15 VITALS — BP 124/62 | HR 59 | Ht 66.0 in | Wt 99.0 lb

## 2012-09-15 DIAGNOSIS — N390 Urinary tract infection, site not specified: Secondary | ICD-10-CM

## 2012-09-15 DIAGNOSIS — I480 Paroxysmal atrial fibrillation: Secondary | ICD-10-CM

## 2012-09-15 DIAGNOSIS — I4891 Unspecified atrial fibrillation: Secondary | ICD-10-CM

## 2012-09-15 DIAGNOSIS — R0609 Other forms of dyspnea: Secondary | ICD-10-CM

## 2012-09-15 DIAGNOSIS — R0600 Dyspnea, unspecified: Secondary | ICD-10-CM

## 2012-09-15 DIAGNOSIS — R5381 Other malaise: Secondary | ICD-10-CM

## 2012-09-15 DIAGNOSIS — Z950 Presence of cardiac pacemaker: Secondary | ICD-10-CM

## 2012-09-15 DIAGNOSIS — R531 Weakness: Secondary | ICD-10-CM

## 2012-09-15 DIAGNOSIS — I495 Sick sinus syndrome: Secondary | ICD-10-CM | POA: Insufficient documentation

## 2012-09-15 DIAGNOSIS — R0602 Shortness of breath: Secondary | ICD-10-CM

## 2012-09-15 LAB — PACEMAKER DEVICE OBSERVATION
BRDY-0002RV: 40 {beats}/min
DEVICE MODEL PM: 166745

## 2012-09-15 NOTE — Progress Notes (Signed)
  Subjective:    Patient ID: Felicia [name], female    DOB: 05/26/1912, 79 y.o.   MRN: [id number]  HPI She had a pacemaker check with Dr. Johney Frame earlier today and seemed to be doing well from his standpoint. He has suggested perhaps she be on chronic anticoagulation other than aspirin but we will let her discuss this with Dr. Patty Sermons. She does live alone and there is a risk of fall. She apparently has another urinary tract infection. In June she had Klebsiella UTI which had intermediate sensitivity to nitrofurantoin. We treated her with Cipro. She thought she felt a bit better after that. She has generalized weakness and fatigue. Explained to her this was likely to be age related. She does have history of chronic compression fractures of her back. She's taking 2 hydrocodone tablets daily. Says sometimes in the late afternoon her legs feel weak and she gets tired. Says she's not overdoing it around her house. She does have some home health services where they clean up her house a bit. She lives down the road from her son. Her granddaughter is a Engineer, civil (consulting) is wondering if statin medication is causing her to be fatigued and feeling fuzzyheaded. She suggested maybe that pt take her statin medication at night. I don't think it makes that much difference. She's been on statins for a long time and is never seem to be bothered by them. She's wondering if hydrocodone/APAP makes her feel that way. Away she's going to notice hydrocodone/APAP make her symptomatic is to stop it for. Of time and see. She may substitute extra strength Tylenol instead. She says previously extract Tylenol did not help her back pain. I did a chest x-ray today. She has no evidence of congestive heart failure. She's wondering if cholesteatoma is causing some dizziness. Possible. In June her hemoglobin was 9.9 g. She has history of chronic anemia. I did draw anemia studies once again today. We also did a complete metabolic panel. TSH in June was normal.  Urine was sent for culture. We will not treat today pending culture results.  She seems to grow different organisms at different times. In June 2014, she had a  Klebsiella UTI. In December 2013, she had an Enterococcus UTI which was sensitive to nitrofurantoin  and ampicillin but resistant to Cipro. In June 2013 she had an Escherichia coli UTI. I am a bit reluctant to put her on antibiotic suppression therapy with all these different organisms growing greater than 100,000 Col/ml as significant urinary tract infections.    Review of Systems     Objective:   Physical Exam Chest is clear to auscultation. Cardiac exam regular rate and rhythm. Extremities without edema. She is alert and oriented. She seems worried about her health          Assessment & Plan:  Anxiety  History of anemia  Urinary tract infection  Pacemaker  History of thoracic compression fracture with musculoskeletal pain treated with low-dose hydrocodone/APAP twice daily  History of cholesteatoma  Generalized weakness-complains of occasional dizziness late afternoon  History of thoracic aortic aneurysm  Plan: Review lab work and make further recommendations. Urine culture is pending. Explained to patient I think the symptoms are age related and not necessarily medication related.  25 minutes spent with patient and her granddaughter.

## 2012-09-15 NOTE — Progress Notes (Signed)
Felicia Mackintosh, MD: Primary Cardiologist:  Dr Felicia [name] is a 79 y.o. female with a h/o tachycardia/ bradycardia sp PPM Conservation officer, historic buildings) by Dr Felicia [name] at [facility] who presents today to establish care in the Electrophysiology device clinic.   The patient reports doing very well since having a pacemaker implanted and remains very active despite her age.   Today, she  denies symptoms of palpitations, chest pain, orthopnea, PND, lower extremity edema, presyncope, syncope, or neurologic sequela.  She has chronic but mild SOB and dizziness.  The patientis tolerating medications without difficulties and is otherwise without complaint today.   Past Medical History  Diagnosis Date  . Hypertension   . Hyperlipidemia   . Arthritis   . Macular degeneration   . Aortic insufficiency   . Vitamin D deficiency   . Coronary artery disease   . LBBB (left bundle branch block)   . Ascending aortic aneurysm   . Hip fracture   . Paroxysmal atrial fibrillation   . Tachycardia-bradycardia     s/pPPM   Past Surgical History  Procedure Laterality Date  . Appendectomy    . Abdominal hysterectomy    . Fracture surgery      left hip  . Eye surgery      cataracts  . Coronary artery bypass graft  11/11/1992    EF 70%  . Transthoracic echocardiogram  08/18/2009    EF 60-65%  . US echocardiography  07/22/2009    EF 55-60%  . Transthoracic echocardiogram  05/02/2009    EF 45-50%  . US echocardiography  07/18/2008    EF 55-60%  . US echocardiography  07/14/2007    EF  55-60%  . US echocardiography  06/09/2006    EF 55-60%  . Cardiovascular stress test  08/21/2002    EF 65%, NO ISCHEMIA  . Pacemaker insertion  06/2010    [employer] Altrua 60 VVI pacemaker implanted by Dr Felicia [name] at [facility] for tachy/brady syndrome    History   Social History  . Marital Status: Widowed    Spouse Name: Felicia [name]    Number of Children: Felicia [name]  . Years of Education: Felicia [name]   Occupational History  . Not on file.   Social  History Main Topics  . Smoking status: Never Smoker   . Smokeless tobacco: Never Used  . Alcohol Use: No  . Drug Use: Not on file  . Sexual Activity: Not on file   Other Topics Concern  . Not on file   Social History Narrative  . No narrative on file    Family History  Problem Relation Age of Onset  . Heart attack Son     Allergies  Allergen Reactions  . Sulfa Antibiotics Nausea Only    Current Outpatient Prescriptions  Medication Sig Dispense Refill  . ALPRAZolam (XANAX) 0.5 MG tablet TAKE 1/2 TO 1 TABLET BY MOUTH TWICE DAILY  60 tablet  5  . aspirin EC 81 MG tablet Take 81 mg by mouth daily.      . ferrous sulfate 325 (65 FE) MG tablet Take 325 mg by mouth daily with breakfast.        . HYDROcodone-acetaminophen (NORCO/VICODIN) 5-325 MG per tablet TAKE ONE TABLET BY MOUTH EVERY TWELVE HOURS AS NEEDED FOR PAIN  60 tablet  5  . isosorbide mononitrate (IMDUR) 60 MG 24 hr tablet TAKE 1/2 TABLET ONCE DAILY  45 tablet  prn  . lisinopril (PRINIVIL,ZESTRIL) 20 MG tablet TAKE ONE TABLET BY MOUTH ONCE  DAILY  90 tablet  3  . lovastatin (MEVACOR) 20 MG tablet TAKE ONE TABLET BY MOUTH ONCE DAILY  30 tablet  11  . metoprolol tartrate (LOPRESSOR) 25 MG tablet TAKE 1/2 TABLET BY MOUTH TWICE DAILY  90 tablet  3  . Multiple Vitamin (MULTIVITAMIN) capsule Take 1 capsule by mouth daily.        . Multiple Vitamins-Minerals (OCUVITE ADULT 50+ PO) Take by mouth 2 (two) times daily at 10 AM and 5 PM.        . Vitamin D, Ergocalciferol, (DRISDOL) 50000 UNITS CAPS TAKE ONE CAPSULE BY MOUTH ONCE A WEEK FOR 12 WEEKS.  8 capsule  0   Current Facility-Administered Medications  Medication Dose Route Frequency Provider Last Rate Last Dose  . TDaP (BOOSTRIX) injection 0.5 mL  0.5 mL Intramuscular Once Felicia Mackintosh, MD        ROS- all systems are reviewed and negative except as per HPI  Physical Exam: Filed Vitals:   09/15/12 1406  BP: 124/62  Pulse: 59  Height: 5\' 6"  (1.676 m)  Weight: 99 lb  (44.906 kg)  SpO2: 96%    GEN- The patient is elderly appearing, alert and oriented x 3 today.   Head- normocephalic, atraumatic Eyes-  Sclera clear, conjunctiva pink Ears- hearing intact Oropharynx- clear Neck- supple, no JVP Lymph- no cervical lymphadenopathy Lungs- Clear to ausculation bilaterally, normal work of breathing Chest- pacemaker pocket is well healed Heart- Regular rate and rhythm  GI- soft, NT, ND, + BS Extremities- no clubbing, cyanosis, or edema MS- age appropriate atrophy Skin- no rash or lesion Psych- euthymic mood, full affect Neuro- strength and sensation are intact  Pacemaker interrogation- reviewed in detail today,  See PACEART report  Assessment and Plan:  1. Tachy/brady syndrome Normal pacemaker function See Pace Art report No changes today She has a single chamber VVI pacemaker which paces 0%.  Histograms are appropriate  2. Paroxysmal atrial fibrillation CHADS2VASC score of at least 4.  She should ideally be anticoagulated.  AVERROES would suggest that eliquis would be a better alternative for stroke prevention than her current ASA.  She is not to make this change today but is willing to further discuss with Dr Patty Sermons.  Return to the device clinic to see Nehemiah Settle in 1 year

## 2012-09-15 NOTE — Patient Instructions (Signed)
Your physician wants you to follow-up in: 12 months with Dr Allred You will receive a reminder letter in the mail two months in advance. If you don't receive a letter, please call our office to schedule the follow-up appointment.  

## 2012-09-15 NOTE — Patient Instructions (Addendum)
Continue same meds and return in December. Urine culture and labs are pending

## 2012-09-16 LAB — POCT URINALYSIS DIPSTICK
Glucose, UA: NEGATIVE
Nitrite, UA: NEGATIVE
Spec Grav, UA: 1.02
Urobilinogen, UA: NEGATIVE

## 2012-09-16 LAB — CBC WITH DIFFERENTIAL/PLATELET
Basophils Relative: 1 % (ref 0–1)
Hemoglobin: 10.3 g/dL — ABNORMAL LOW (ref 12.0–15.0)
Lymphs Abs: 1.4 10*3/uL (ref 0.7–4.0)
MCHC: 32.2 g/dL (ref 30.0–36.0)
Monocytes Relative: 6 % (ref 3–12)
Neutro Abs: 3.4 10*3/uL (ref 1.7–7.7)
Neutrophils Relative %: 64 % (ref 43–77)
Platelets: 224 10*3/uL (ref 150–400)
RBC: 3.61 MIL/uL — ABNORMAL LOW (ref 3.87–5.11)

## 2012-09-16 LAB — COMPREHENSIVE METABOLIC PANEL
ALT: <8 U/L (ref 0–35)
Albumin: 4.1 g/dL (ref 3.5–5.2)
Alkaline Phosphatase: 52 U/L (ref 39–117)
Glucose, Bld: 92 mg/dL (ref 70–99)
Potassium: 5.1 mEq/L (ref 3.5–5.3)
Sodium: 135 mEq/L (ref 135–145)
Total Bilirubin: 0.5 mg/dL (ref 0.3–1.2)
Total Protein: 7 g/dL (ref 6.0–8.3)

## 2012-09-16 LAB — IRON AND TIBC
%SAT: 15 % — ABNORMAL LOW (ref 20–55)
TIBC: 255 ug/dL (ref 250–470)

## 2012-09-16 LAB — TSH: TSH: 1.902 u[IU]/mL (ref 0.350–4.500)

## 2012-09-16 LAB — FOLATE: Folate: 19.9 ng/mL

## 2012-09-17 LAB — URINE CULTURE: Colony Count: >=100000

## 2012-09-19 ENCOUNTER — Other Ambulatory Visit: Payer: Self-pay

## 2012-09-19 NOTE — Progress Notes (Signed)
Patient informed. 

## 2012-09-19 NOTE — Progress Notes (Signed)
Patient informed. Rx called in for Cipro as directed

## 2012-10-10 ENCOUNTER — Telehealth: Payer: Self-pay | Admitting: Internal Medicine

## 2012-10-10 MED ORDER — AZITHROMYCIN 250 MG PO TABS: ORAL_TABLET | ORAL | Status: DC

## 2012-10-10 NOTE — Telephone Encounter (Signed)
Call in Zithromax Z pak for her 2 tabs po day 1 then one po days 2-5

## 2012-11-16 ENCOUNTER — Ambulatory Visit (INDEPENDENT_AMBULATORY_CARE_PROVIDER_SITE_OTHER): Payer: Medicare Other | Admitting: Ophthalmology

## 2012-11-16 DIAGNOSIS — I10 Essential (primary) hypertension: Secondary | ICD-10-CM

## 2012-11-16 DIAGNOSIS — H3530 Unspecified macular degeneration: Secondary | ICD-10-CM

## 2012-11-16 DIAGNOSIS — H35039 Hypertensive retinopathy, unspecified eye: Secondary | ICD-10-CM

## 2012-11-16 DIAGNOSIS — H43819 Vitreous degeneration, unspecified eye: Secondary | ICD-10-CM

## 2012-11-16 DIAGNOSIS — H2700 Aphakia, unspecified eye: Secondary | ICD-10-CM

## 2012-12-20 ENCOUNTER — Encounter: Payer: Self-pay | Admitting: Cardiology

## 2012-12-20 ENCOUNTER — Ambulatory Visit (INDEPENDENT_AMBULATORY_CARE_PROVIDER_SITE_OTHER): Payer: Medicare Other | Admitting: Cardiology

## 2012-12-20 VITALS — BP 127/50 | HR 54 | Ht 66.0 in | Wt 99.0 lb

## 2012-12-20 DIAGNOSIS — I359 Nonrheumatic aortic valve disorder, unspecified: Secondary | ICD-10-CM

## 2012-12-20 DIAGNOSIS — I4891 Unspecified atrial fibrillation: Secondary | ICD-10-CM

## 2012-12-20 DIAGNOSIS — M1990 Unspecified osteoarthritis, unspecified site: Secondary | ICD-10-CM

## 2012-12-20 DIAGNOSIS — I351 Nonrheumatic aortic (valve) insufficiency: Secondary | ICD-10-CM

## 2012-12-20 DIAGNOSIS — I480 Paroxysmal atrial fibrillation: Secondary | ICD-10-CM

## 2012-12-20 DIAGNOSIS — I447 Left bundle-branch block, unspecified: Secondary | ICD-10-CM

## 2012-12-20 DIAGNOSIS — I712 Thoracic aortic aneurysm, without rupture: Secondary | ICD-10-CM

## 2012-12-20 DIAGNOSIS — I495 Sick sinus syndrome: Secondary | ICD-10-CM

## 2012-12-20 NOTE — Assessment & Plan Note (Signed)
The patient has osteoporosis, osteoarthritis, and kyphosis.  She has chronic back pain.  She is on hydrocodone twice a day.

## 2012-12-20 NOTE — Patient Instructions (Signed)
Your physician recommends that you continue on your current medications as directed. Please refer to the Current Medication list given to you today.  Your physician wants you to follow-up in: 6 month ov/ekg You will receive a reminder letter in the mail two months in advance. If you don't receive a letter, please call our office to schedule the follow-up appointment.  

## 2012-12-20 NOTE — Assessment & Plan Note (Signed)
The patient has not had any recent symptoms of congestive heart failure.

## 2012-12-20 NOTE — Progress Notes (Signed)
Felicia [name] Date of Birth:  02-04-1912 [address] [place], [place]  [address] [phone]         Fax   [phone]  History of Present Illness: This pleasant 79 year old woman is seen for a six-month followup office visit.  She has a remote history of paroxysmal atrial fibrillation. She does have an underlying pacemaker which was implanted for bradycardia on 06/23/10 at [facility]. She has not been experiencing any chest pain or angina.  She does have a history of ischemic heart disease and had prior angioplasty to the LAD in 1994. She has a known ascending aortic aneurysm and a known murmur of aortic insufficiency.  Since last visit she has been doing well. Current Outpatient Prescriptions  Medication Sig Dispense Refill  . ALPRAZolam (XANAX) 0.5 MG tablet TAKE 1/2 TO 1 TABLET BY MOUTH TWICE DAILY  60 tablet  5  . aspirin EC 81 MG tablet Take 81 mg by mouth daily.      Marland Kitchen azithromycin (ZITHROMAX) 250 MG tablet Take 2 tablets on day one, then 1 tablet daily thereafter  6 tablet  0  . ferrous sulfate 325 (65 FE) MG tablet Take 325 mg by mouth daily with breakfast.        . HYDROcodone-acetaminophen (NORCO/VICODIN) 5-325 MG per tablet TAKE ONE TABLET BY MOUTH EVERY TWELVE HOURS AS NEEDED FOR PAIN  60 tablet  5  . isosorbide mononitrate (IMDUR) 60 MG 24 hr tablet TAKE 1/2 TABLET ONCE DAILY  45 tablet  prn  . lisinopril (PRINIVIL,ZESTRIL) 20 MG tablet TAKE ONE TABLET BY MOUTH ONCE DAILY  90 tablet  3  . lovastatin (MEVACOR) 20 MG tablet TAKE ONE TABLET BY MOUTH ONCE DAILY  30 tablet  11  . metoprolol tartrate (LOPRESSOR) 25 MG tablet TAKE 1/2 TABLET BY MOUTH TWICE DAILY  90 tablet  3  . Multiple Vitamin (MULTIVITAMIN) capsule Take 1 capsule by mouth daily.        . Multiple Vitamins-Minerals (OCUVITE ADULT 50+ PO) Take by mouth 2 (two) times daily at 10 AM and 5 PM.        . NON FORMULARY Vit b complete 1x day      . Vitamin D, Ergocalciferol, (DRISDOL) 50000 UNITS CAPS  TAKE ONE CAPSULE BY MOUTH ONCE A WEEK FOR 12 WEEKS.  8 capsule  0   Current Facility-Administered Medications  Medication Dose Route Frequency Provider Last Rate Last Dose  . TDaP (BOOSTRIX) injection 0.5 mL  0.5 mL Intramuscular Once Margaree Mackintosh, MD        Allergies  Allergen Reactions  . Sulfa Antibiotics Nausea Only    Patient Active Problem List   Diagnosis Date Noted  . Cardiac pacemaker 07/28/2010    Priority: High  . Aortic insufficiency 06/17/2010    Priority: High  . Tachycardia-bradycardia 09/15/2012  . PAF (paroxysmal atrial fibrillation) 01/26/2011  . History of compression fracture of vertebral column 01/12/2011  . HTN (hypertension) 06/17/2010  . Coronary disease 06/17/2010  . Other and unspecified hyperlipidemia 06/17/2010  . Cholesteatoma of attic of right ear 06/17/2010  . Frequent UTI 06/17/2010  . Macular degeneration 06/17/2010  . Osteoporosis 06/17/2010  . Osteoarthritis 06/17/2010  . Squamous cell carcinoma of face 06/17/2010  . Anemia 06/17/2010  . Aneurysm of ascending aorta 06/17/2010    History  Smoking status  . Never Smoker   Smokeless tobacco  . Never Used    History  Alcohol Use No    Family  History  Problem Relation Age of Onset  . Heart attack Son     Review of Systems: Constitutional: no fever chills diaphoresis or fatigue or change in weight.  Head and neck: no hearing loss, no epistaxis, no photophobia or visual disturbance. Respiratory: No cough, shortness of breath or wheezing. Cardiovascular: No chest pain peripheral edema, palpitations. Gastrointestinal: No abdominal distention, no abdominal pain, no change in bowel habits hematochezia or melena. Genitourinary: No dysuria, no frequency, no urgency, no nocturia. Musculoskeletal:No arthralgias, no back pain, no gait disturbance or myalgias. Neurological: No dizziness, no headaches, no numbness, no seizures, no syncope, no weakness, no tremors. Hematologic: No  lymphadenopathy, no easy bruising. Psychiatric: No confusion, no hallucinations, no sleep disturbance.    Physical Exam: Filed Vitals:   12/20/12 1126  BP: 127/50  Pulse: 54   the general appearance reveals a thin elderly woman in no acute distress.The head and neck exam reveals pupils equal and reactive.  Extraocular movements are full.  There is no scleral icterus.  The mouth and pharynx are normal.  The neck is supple.  The carotids reveal no bruits.  The jugular venous pressure is normal.  The  thyroid is not enlarged.  There is no lymphadenopathy.  The chest is clear to percussion and auscultation.  There are no rales or rhonchi.  Expansion of the chest is symmetrical.  The pacemaker is in the left upper chest.  The pacing catheter wire he is quite prominent on to her thin skin there is no evidence of erosion or infection. The precordium is quiet.  The first heart sound is normal.  The second heart sound is physiologically split.  There is no gallop rub or click.  There is a grade 2/6 decrescendo murmur of aortic insufficiency along the left sternal edge. There is no abnormal lift or heave.  The abdomen is soft and nontender.  The bowel sounds are normal.  The liver and spleen are not enlarged.  There are no abdominal masses.  There are no abdominal bruits.  Extremities reveal good pedal pulses.  There is no phlebitis and there is 2+ ankle edema  There is no cyanosis or clubbing.  Strength is normal and symmetrical in all extremities.  There is no lateralizing weakness.  There are no sensory deficits.  The skin is warm and dry.  There is no rash.  EKG shows normal sinus rhythm left axis deviation left bundle branch block and a normal PR interval of 144 ms.  No paced beats are seen   Assessment / Plan: The patient is doing well.  Continue same medications.  She continues to be able to live by herself in [place].  She has a home health aide who comes in 3 days a week to help her with taking a  shower etc. she also gets Meals on Wheels.  She uses Xanax for palpitations or occasional chest pain and this seems to work better than sublingual nitroglycerin for her. She has a past history of anemia and her blood work is followed by Dr. Willette Alma here in about 6 months for followup office visit and EKG

## 2012-12-20 NOTE — Assessment & Plan Note (Signed)
The patient has a past history of paroxysmal atrial fibrillation.  She herself has not been aware of any racing of her heart.  However some episodes of atrial fibrillation were noted on interrogation of her pacemaker by Dr. Johney Frame.  I talked to her today about alternative anticoagulants.  The patient would prefer to stay with her baby aspirin.

## 2012-12-26 ENCOUNTER — Other Ambulatory Visit: Payer: Self-pay | Admitting: Internal Medicine

## 2012-12-26 ENCOUNTER — Other Ambulatory Visit: Payer: Self-pay | Admitting: *Deleted

## 2012-12-26 MED ORDER — HYDROCODONE-ACETAMINOPHEN 5-325 MG PO TABS: ORAL_TABLET | ORAL | Status: DC

## 2012-12-26 NOTE — Telephone Encounter (Signed)
Print up and mail Rx. Call Patient and tell her she will need to take it to pharmacy  As it cannot be called in anymore.  Give #120 tablets one po bid prn pain

## 2012-12-29 ENCOUNTER — Other Ambulatory Visit: Payer: Medicare Other | Admitting: Internal Medicine

## 2012-12-29 ENCOUNTER — Ambulatory Visit: Payer: Medicaid Other | Admitting: Internal Medicine

## 2012-12-29 ENCOUNTER — Encounter: Payer: Self-pay | Admitting: Internal Medicine

## 2012-12-29 ENCOUNTER — Ambulatory Visit (INDEPENDENT_AMBULATORY_CARE_PROVIDER_SITE_OTHER): Payer: Medicare Other | Admitting: Internal Medicine

## 2012-12-29 VITALS — BP 124/60 | HR 68 | Temp 98.3°F | Wt 99.0 lb

## 2012-12-29 DIAGNOSIS — Z79899 Other long term (current) drug therapy: Secondary | ICD-10-CM

## 2012-12-29 DIAGNOSIS — I10 Essential (primary) hypertension: Secondary | ICD-10-CM

## 2012-12-29 DIAGNOSIS — N390 Urinary tract infection, site not specified: Secondary | ICD-10-CM

## 2012-12-29 DIAGNOSIS — I359 Nonrheumatic aortic valve disorder, unspecified: Secondary | ICD-10-CM

## 2012-12-29 DIAGNOSIS — I712 Thoracic aortic aneurysm, without rupture, unspecified: Secondary | ICD-10-CM

## 2012-12-29 DIAGNOSIS — M549 Dorsalgia, unspecified: Secondary | ICD-10-CM

## 2012-12-29 DIAGNOSIS — Z8781 Personal history of (healed) traumatic fracture: Secondary | ICD-10-CM

## 2012-12-29 DIAGNOSIS — G8929 Other chronic pain: Secondary | ICD-10-CM

## 2012-12-29 DIAGNOSIS — Z950 Presence of cardiac pacemaker: Secondary | ICD-10-CM

## 2012-12-29 DIAGNOSIS — I351 Nonrheumatic aortic (valve) insufficiency: Secondary | ICD-10-CM

## 2012-12-29 DIAGNOSIS — E785 Hyperlipidemia, unspecified: Secondary | ICD-10-CM

## 2012-12-29 LAB — CBC WITH DIFFERENTIAL/PLATELET
Basophils Absolute: 0 10*3/uL (ref 0.0–0.1)
Basophils Relative: 1 % (ref 0–1)
Eosinophils Absolute: 0.2 10*3/uL (ref 0.0–0.7)
Hemoglobin: 10.2 g/dL — ABNORMAL LOW (ref 12.0–15.0)
MCH: 28.7 pg (ref 26.0–34.0)
MCHC: 32.8 g/dL (ref 30.0–36.0)
MCV: 87.6 fL (ref 78.0–100.0)
Monocytes Absolute: 0.3 10*3/uL (ref 0.1–1.0)
Monocytes Relative: 6 % (ref 3–12)
Neutrophils Relative %: 66 % (ref 43–77)
Platelets: 205 10*3/uL (ref 150–400)
RDW: 15.4 % (ref 11.5–15.5)

## 2012-12-29 LAB — BASIC METABOLIC PANEL
BUN: 27 mg/dL — ABNORMAL HIGH (ref 6–23)
Calcium: 9.5 mg/dL (ref 8.4–10.5)
Glucose, Bld: 91 mg/dL (ref 70–99)
Sodium: 139 mEq/L (ref 135–145)

## 2012-12-29 LAB — HEPATIC FUNCTION PANEL
ALT: <8 U/L (ref 0–35)
AST: 14 U/L (ref 0–37)
Alkaline Phosphatase: 45 U/L (ref 39–117)
Bilirubin, Direct: 0.1 mg/dL (ref 0.0–0.3)
Indirect Bilirubin: 0.5 mg/dL (ref 0.0–0.9)
Total Bilirubin: 0.6 mg/dL (ref 0.3–1.2)
Total Protein: 6.5 g/dL (ref 6.0–8.3)

## 2012-12-29 LAB — LIPID PANEL
Cholesterol: 139 mg/dL (ref 0–200)
HDL: 56 mg/dL (ref >39)
LDL Cholesterol: 70 mg/dL (ref 0–99)
Total CHOL/HDL Ratio: 2.5 Ratio
VLDL: 13 mg/dL (ref 0–40)

## 2013-01-03 ENCOUNTER — Ambulatory Visit: Payer: Medicare Other | Admitting: Internal Medicine

## 2013-01-06 ENCOUNTER — Encounter: Payer: Self-pay | Admitting: Internal Medicine

## 2013-01-06 NOTE — Progress Notes (Signed)
   Subjective:    Patient ID: Felicia [name], female    DOB: 10-11-1912, 79 y.o.   MRN: [id number]  HPI Patient in today for six-month recheck. History of hypertension, hyperlipidemia, aortic aneurysm which has been deemed by Dr. Tyrone Sage is inoperable, cholesteatoma seen by Dr. Lenoria Farrier at [facility], coronary artery disease. Occasionally gets a urinary tract infection. Likes to keep Cipro on hand and new prescription was given to her today. She may have some issues obtaining pain medication now since hydrocodone/APAPcan't be called in or faxed and the [place] that [place]. We recently mailed her the written prescription which she has not received as of today. Accompanied by her granddaughter who is a Engineer, civil (consulting). She still has some help in the home through the county program several days a week. Basically she lives alone and does fine with relatives living close by. Her mind is good. She is bright and alert. Has chronic back pain with remote compression fractures for which she takes hydrocodone/APAP. Remote history of PAF. She has a pacemaker. History of aortic insufficiency.    Review of Systems     Objective:   Physical Exam she has a discolored lesion on her right cheek which needs to be seen by dermatologist. This is new since last visit. History of squamous cell carcinoma right face above this area. Neck is supple without JVD thyromegaly or bruits. Chest clear. Cardiac exam: regular rate and rhythm with normal S1 and S2. Extremities without edema. Skin is warm and dry. She is alert and oriented x3.        Assessment & Plan:  Hypertension  Hyperlipidemia  Aortic aneurysm  Coronary artery disease  Cholesteatoma-right ear  Chronic back pain related to old compression fractures  History of pacemaker  History of aortic insufficiency  History of paroxysmal atrial fibrillation  History of recurrent urinary tract infections  History of iron deficiency anemia  History  of anxiety  Abnormal skin lesion right cheek  Plan: To see dermatologist regarding skin lesion right cheek. Return in 6 months for six-month recheck only not physical examination. Continue same medications. New prescription for Cipro to have on hand should urinary tract infection develop during the winter months and she is unable to get to [place]. It is remarkable she does so well at her age. 25 minutes spent with patient.

## 2013-01-06 NOTE — Patient Instructions (Signed)
Continue same medications and return in 6 months 

## 2013-02-15 ENCOUNTER — Other Ambulatory Visit: Payer: Self-pay | Admitting: Internal Medicine

## 2013-02-16 NOTE — Telephone Encounter (Signed)
Refill x 6 months 

## 2013-02-17 ENCOUNTER — Telehealth: Payer: Self-pay | Admitting: Internal Medicine

## 2013-02-17 NOTE — Telephone Encounter (Signed)
Spoke with Rise Paganini and advised her to call the ENT who Rx the Avelox about anti-nausea medicine.

## 2013-02-23 ENCOUNTER — Other Ambulatory Visit: Payer: Self-pay

## 2013-02-23 MED ORDER — HYDROCODONE-ACETAMINOPHEN 5-325 MG PO TABS: ORAL_TABLET | ORAL | Status: DC

## 2013-03-17 DIAGNOSIS — G8929 Other chronic pain: Secondary | ICD-10-CM

## 2013-03-17 DIAGNOSIS — Z950 Presence of cardiac pacemaker: Secondary | ICD-10-CM

## 2013-03-17 DIAGNOSIS — M549 Dorsalgia, unspecified: Secondary | ICD-10-CM

## 2013-03-17 DIAGNOSIS — Z8781 Personal history of (healed) traumatic fracture: Secondary | ICD-10-CM

## 2013-03-17 DIAGNOSIS — I359 Nonrheumatic aortic valve disorder, unspecified: Secondary | ICD-10-CM

## 2013-03-31 ENCOUNTER — Other Ambulatory Visit: Payer: Self-pay | Admitting: Internal Medicine

## 2013-04-13 ENCOUNTER — Telehealth: Payer: Self-pay | Admitting: Internal Medicine

## 2013-04-13 ENCOUNTER — Other Ambulatory Visit: Payer: Self-pay

## 2013-04-13 DIAGNOSIS — J069 Acute upper respiratory infection, unspecified: Secondary | ICD-10-CM

## 2013-04-13 MED ORDER — AZITHROMYCIN 250 MG PO TABS: ORAL_TABLET | ORAL | Status: DC

## 2013-04-13 NOTE — Telephone Encounter (Signed)
Patient informed. 

## 2013-04-13 NOTE — Telephone Encounter (Signed)
Call in Zithromax Z-pak 2 po day 1 followed by one po days 2-5. See next week if no better.

## 2013-04-28 ENCOUNTER — Other Ambulatory Visit: Payer: Self-pay

## 2013-04-28 MED ORDER — HYDROCODONE-ACETAMINOPHEN 5-325 MG PO TABS: ORAL_TABLET | ORAL | Status: DC

## 2013-06-22 ENCOUNTER — Other Ambulatory Visit: Payer: Self-pay | Admitting: Internal Medicine

## 2013-06-26 ENCOUNTER — Other Ambulatory Visit: Payer: Self-pay | Admitting: Internal Medicine

## 2013-06-29 ENCOUNTER — Ambulatory Visit (INDEPENDENT_AMBULATORY_CARE_PROVIDER_SITE_OTHER): Payer: Medicare Other | Admitting: Internal Medicine

## 2013-06-29 ENCOUNTER — Encounter: Payer: Self-pay | Admitting: Internal Medicine

## 2013-06-29 VITALS — BP 164/74 | HR 68 | Temp 98.1°F | Wt 101.0 lb

## 2013-06-29 DIAGNOSIS — E785 Hyperlipidemia, unspecified: Secondary | ICD-10-CM

## 2013-06-29 DIAGNOSIS — I10 Essential (primary) hypertension: Secondary | ICD-10-CM

## 2013-06-29 DIAGNOSIS — N390 Urinary tract infection, site not specified: Secondary | ICD-10-CM

## 2013-06-29 DIAGNOSIS — Z79899 Other long term (current) drug therapy: Secondary | ICD-10-CM

## 2013-06-29 DIAGNOSIS — D649 Anemia, unspecified: Secondary | ICD-10-CM

## 2013-06-29 LAB — CBC WITH DIFFERENTIAL/PLATELET
BASOS ABS: 0 10*3/uL (ref 0.0–0.1)
BASOS PCT: 1 % (ref 0–1)
EOS ABS: 0.2 10*3/uL (ref 0.0–0.7)
EOS PCT: 4 % (ref 0–5)
HCT: 28.7 % — ABNORMAL LOW (ref 36.0–46.0)
Hemoglobin: 9.2 g/dL — ABNORMAL LOW (ref 12.0–15.0)
LYMPHS ABS: 1 10*3/uL (ref 0.7–4.0)
Lymphocytes Relative: 22 % (ref 12–46)
MCH: 28.7 pg (ref 26.0–34.0)
MCHC: 32.1 g/dL (ref 30.0–36.0)
MCV: 89.4 fL (ref 78.0–100.0)
Monocytes Absolute: 0.3 10*3/uL (ref 0.1–1.0)
Monocytes Relative: 7 % (ref 3–12)
NEUTROS PCT: 66 % (ref 43–77)
Neutro Abs: 3 10*3/uL (ref 1.7–7.7)
Platelets: 208 10*3/uL (ref 150–400)
RBC: 3.21 MIL/uL — ABNORMAL LOW (ref 3.87–5.11)
RDW: 14.7 % (ref 11.5–15.5)
WBC: 4.6 10*3/uL (ref 4.0–10.5)

## 2013-06-29 LAB — POCT URINALYSIS DIPSTICK
BILIRUBIN UA: NEGATIVE
Glucose, UA: NEGATIVE
KETONES UA: NEGATIVE
Nitrite, UA: NEGATIVE
PH UA: 7.5
PROTEIN UA: NEGATIVE
Spec Grav, UA: 1.01
Urobilinogen, UA: NEGATIVE

## 2013-06-29 LAB — BASIC METABOLIC PANEL
BUN: 26 mg/dL — ABNORMAL HIGH (ref 6–23)
CO2: 32 meq/L (ref 19–32)
CREATININE: 0.79 mg/dL (ref 0.50–1.10)
Calcium: 9.1 mg/dL (ref 8.4–10.5)
Chloride: 101 mEq/L (ref 96–112)
Glucose, Bld: 91 mg/dL (ref 70–99)
Potassium: 5.7 mEq/L — ABNORMAL HIGH (ref 3.5–5.3)
Sodium: 138 mEq/L (ref 135–145)

## 2013-06-29 LAB — HEPATIC FUNCTION PANEL
ALK PHOS: 53 U/L (ref 39–117)
AST: 15 U/L (ref 0–37)
Albumin: 3.8 g/dL (ref 3.5–5.2)
BILIRUBIN DIRECT: 0.1 mg/dL (ref 0.0–0.3)
BILIRUBIN TOTAL: 0.6 mg/dL (ref 0.2–1.2)
Indirect Bilirubin: 0.5 mg/dL (ref 0.2–1.2)
Total Protein: 6.5 g/dL (ref 6.0–8.3)

## 2013-06-29 LAB — LIPID PANEL
CHOLESTEROL: 136 mg/dL (ref 0–200)
HDL: 58 mg/dL (ref >39)
LDL Cholesterol: 64 mg/dL (ref 0–99)
Total CHOL/HDL Ratio: 2.3 Ratio
Triglycerides: 72 mg/dL (ref <150)
VLDL: 14 mg/dL (ref 0–40)

## 2013-06-29 MED ORDER — CIPROFLOXACIN HCL 250 MG PO TABS: 250.0000 mg | ORAL_TABLET | Freq: Two times a day (BID) | ORAL | Status: DC

## 2013-06-29 MED ORDER — HYDROCODONE-ACETAMINOPHEN 5-325 MG PO TABS: ORAL_TABLET | ORAL | Status: DC

## 2013-06-29 NOTE — Progress Notes (Signed)
   Subjective:    Patient ID: Felicia [name], female    DOB: October 23, 1912, 80 y.o.   MRN: [id number]  HPI   80 year old White Female  For 6 month recheck. She has a history of coronary artery disease, hypertension, hyperlipidemia, osteoporosis, chronic low back pain. Continues to reside at home. Has some help from family members and also an aide that comes 3 days a week to help her clean her house. She has Meals on Wheels. She has an aortic aneurysm which has been deemed inoperable. History of cholesteatoma seemed to Dr. Idelle Crouch at [facility]. [name] follows her coronary artery disease. Has frequent urinary tract infections. Urinalysis today is abnormal and culture was taken. Takes hydrocodone/APAP for low back pain. Recently diagnosed with melanoma right cheek treated by [facility] dermatology. Had Mohs surgery.       Review of Systems     Objective:   Physical Exam Skin is warm and dry. Nodes none. Neck is supple without JVD thyromegaly or carotid bruits. Chest clear to auscultation. Cardiac exam regular rate and rhythm. Extremities without edema.       Assessment & Plan:  Urinary tract infection-culture pending  Aortic aneurysm-inoperable  Coronary artery disease-stable  Cholesteatoma right ear followed at [facility] for Dr. Idelle Crouch  Chronic back pain related to old compression fractures. Takes hydrocodone/APAP for pain.  History of pacemaker  History of aortic insufficiency  History of paroxysmal atrial fibrillation  Hyperlipidemia-on statin therapy  History of iron deficiency anemia  History of anxiety  History of melanoma right cheek  Plan: Continue same medications. Urine culture is pending. Return in 6 months.  25 minutes with patient and granddaughter

## 2013-07-02 LAB — URINE CULTURE: Colony Count: 35000

## 2013-07-03 ENCOUNTER — Other Ambulatory Visit: Payer: Self-pay

## 2013-07-03 MED ORDER — AMOXICILLIN 500 MG PO CAPS: 500.0000 mg | ORAL_CAPSULE | Freq: Three times a day (TID) | ORAL | Status: DC

## 2013-07-03 NOTE — Progress Notes (Signed)
Patient informed. New Rx sent.

## 2013-07-03 NOTE — Progress Notes (Signed)
Patient informed. 

## 2013-07-07 ENCOUNTER — Other Ambulatory Visit: Payer: Self-pay | Admitting: Internal Medicine

## 2013-07-11 NOTE — Patient Instructions (Signed)
Urine culture is pending. Continue same medications. Followup in 6 months.

## 2013-07-20 ENCOUNTER — Ambulatory Visit (INDEPENDENT_AMBULATORY_CARE_PROVIDER_SITE_OTHER): Payer: Medicare Other | Admitting: Cardiology

## 2013-07-20 ENCOUNTER — Encounter: Payer: Self-pay | Admitting: Cardiology

## 2013-07-20 VITALS — BP 145/62 | HR 59 | Ht 66.0 in | Wt 102.0 lb

## 2013-07-20 DIAGNOSIS — R600 Localized edema: Secondary | ICD-10-CM | POA: Insufficient documentation

## 2013-07-20 DIAGNOSIS — I359 Nonrheumatic aortic valve disorder, unspecified: Secondary | ICD-10-CM

## 2013-07-20 DIAGNOSIS — R0989 Other specified symptoms and signs involving the circulatory and respiratory systems: Secondary | ICD-10-CM

## 2013-07-20 DIAGNOSIS — I2510 Atherosclerotic heart disease of native coronary artery without angina pectoris: Secondary | ICD-10-CM

## 2013-07-20 DIAGNOSIS — I2583 Coronary atherosclerosis due to lipid rich plaque: Secondary | ICD-10-CM

## 2013-07-20 DIAGNOSIS — R609 Edema, unspecified: Secondary | ICD-10-CM

## 2013-07-20 DIAGNOSIS — I480 Paroxysmal atrial fibrillation: Secondary | ICD-10-CM

## 2013-07-20 DIAGNOSIS — R0609 Other forms of dyspnea: Secondary | ICD-10-CM | POA: Insufficient documentation

## 2013-07-20 DIAGNOSIS — I351 Nonrheumatic aortic (valve) insufficiency: Secondary | ICD-10-CM

## 2013-07-20 DIAGNOSIS — I4891 Unspecified atrial fibrillation: Secondary | ICD-10-CM

## 2013-07-20 MED ORDER — FUROSEMIDE 20 MG PO TABS
20.0000 mg | ORAL_TABLET | Freq: Every day | ORAL | Status: DC
Start: 2013-07-20 — End: 2013-07-20

## 2013-07-20 MED ORDER — FUROSEMIDE 20 MG PO TABS: ORAL_TABLET | ORAL | Status: DC

## 2013-07-20 NOTE — Assessment & Plan Note (Signed)
The patient has a past history of paroxysmal atrial fibrillation.  She has not been aware of any recent palpitations or tachycardia.  EKG today shows normal sinus rhythm at 59 per minute.  She does have a pacemaker in place.  She has declined warfarin.  She takes a baby aspirin daily.

## 2013-07-20 NOTE — Assessment & Plan Note (Signed)
The patient does have a history of known ischemic heart disease and has had a previous stent to the LAD in 1994.  Recently she has been feeling well she describes as a pressure in her chest.  It comes and goes.  It does not appear to be related to exertion.  She thinks that it is different from the angina pectoris that she had experienced prior to her stent.  She does take an isosorbide mononitrate each day.  She has not tried sublingual nitroglycerin for her symptoms.  She does have evidence of volume overload and what she describes as a chest pressure may be related to increased fluid.

## 2013-07-20 NOTE — Patient Instructions (Signed)
START LASIX (FUROSEMIDE) 20 MG 1/2 TABLET DAILY, RX SENT TO PHARMACY  Your physician recommends that you schedule a follow-up appointment in: 6 WEEK OV/BMET

## 2013-07-20 NOTE — Progress Notes (Signed)
Felicia [name] Date of Birth:  1912-07-13 [place] [address] [place], [place]  [address] [phone]        Fax   [phone]   History of Present Illness: This pleasant 80 year old woman is seen for a six-month followup office visit. She has a remote history of paroxysmal atrial fibrillation. She does have an underlying pacemaker which was implanted for bradycardia on 06/23/10 at [facility]. She has not been experiencing any chest pain or angina. She does have a history of ischemic heart disease and had prior angioplasty to the LAD in 1994. She has a known ascending aortic aneurysm and a known murmur of aortic insufficiency.  Because of her age and comorbidities, these problems are being managed medically. Since last visit has noted increased shortness of breath.  She has had pain in her back for which she takes hydrocodone.  She was recently treated with amoxicillin for a urinary tract infection.  She has had a history of previous left hip surgery.   Current Outpatient Prescriptions  Medication Sig Dispense Refill  . ALPRAZolam (XANAX) 0.5 MG tablet TAKE 1/2 TO 1 TABLET BY MOUTH TWICE DAILY  60 tablet  5  . aspirin EC 81 MG tablet Take 81 mg by mouth daily.      . ciprofloxacin (CIPRO) 250 MG tablet Take 250 mg by mouth as needed.      . ferrous sulfate 325 (65 FE) MG tablet Take 325 mg by mouth daily with breakfast.        . HYDROcodone-acetaminophen (NORCO/VICODIN) 5-325 MG per tablet TAKE ONE TABLET BY MOUTH EVERY TWELVE HOURS AS NEEDED FOR PAIN  120 tablet  0  . isosorbide mononitrate (IMDUR) 60 MG 24 hr tablet TAKE 1/2 TABLET ONCE DAILY  45 tablet  3  . lisinopril (PRINIVIL,ZESTRIL) 20 MG tablet TAKE ONE TABLET BY MOUTH ONCE DAILY  90 tablet  3  . lovastatin (MEVACOR) 20 MG tablet TAKE ONE TABLET BY MOUTH ONCE DAILY  30 tablet  11  . metoprolol tartrate (LOPRESSOR) 25 MG tablet TAKE 1/2 TABLET BY MOUTH TWICE DAILY  90 tablet  3  . metoprolol tartrate  (LOPRESSOR) 25 MG tablet TAKE 1/2 TABLET BY MOUTH TWICE DAILY  90 tablet  3  . Multiple Vitamin (MULTIVITAMIN) capsule Take 1 capsule by mouth daily.        . Multiple Vitamins-Minerals (OCUVITE ADULT 50+ PO) Take by mouth 2 (two) times daily at 10 AM and 5 PM.        . NON FORMULARY Vit b complete 1x day      . Vitamin D, Ergocalciferol, (DRISDOL) 50000 UNITS CAPS TAKE ONE CAPSULE BY MOUTH ONCE A WEEK FOR 12 WEEKS.  8 capsule  0  . furosemide (LASIX) 20 MG tablet 1/2 TABLET DAILY  45 tablet  1   No current facility-administered medications for this visit.    Allergies  Allergen Reactions  . Sulfa Antibiotics Nausea Only    Patient Active Problem List   Diagnosis Date Noted  . Cardiac pacemaker 07/28/2010    Priority: High  . Aortic insufficiency 06/17/2010    Priority: High  . Peripheral edema 07/20/2013  . Tachycardia-bradycardia 09/15/2012  . PAF (paroxysmal atrial fibrillation) 01/26/2011  . History of compression fracture of vertebral column 01/12/2011  . HTN (hypertension) 06/17/2010  . Coronary disease 06/17/2010  . Other and unspecified hyperlipidemia 06/17/2010  . Cholesteatoma of attic of right ear 06/17/2010  . Frequent UTI 06/17/2010  .  Macular degeneration 06/17/2010  . Osteoporosis 06/17/2010  . Osteoarthritis 06/17/2010  . Squamous cell carcinoma of face 06/17/2010  . Anemia 06/17/2010  . Aneurysm of ascending aorta 06/17/2010    History  Smoking status  . Never Smoker   Smokeless tobacco  . Never Used    History  Alcohol Use No    Family History  Problem Relation Age of Onset  . Heart attack Son     Review of Systems: Constitutional: no fever chills diaphoresis or fatigue or change in weight.  Head and neck: no hearing loss, no epistaxis, no photophobia or visual disturbance. Respiratory: No cough, shortness of breath or wheezing. Cardiovascular: No chest pain peripheral edema, palpitations. Gastrointestinal: No abdominal distention, no  abdominal pain, no change in bowel habits hematochezia or melena. Genitourinary: No dysuria, no frequency, no urgency, no nocturia. Musculoskeletal:No arthralgias, no back pain, no gait disturbance or myalgias. Neurological: No dizziness, no headaches, no numbness, no seizures, no syncope, no weakness, no tremors. Hematologic: No lymphadenopathy, no easy bruising. Psychiatric: No confusion, no hallucinations, no sleep disturbance.    Physical Exam: Filed Vitals:   07/20/13 1052  BP: 145/62  Pulse: 59   the general appearance reveals a pleasant elderly woman in no acute distress.  she has a pronounced kyphosis. The head and neck exam reveals pupils equal and reactive.  Extraocular movements are full.  There is no scleral icterus.  The mouth and pharynx are normal.  The neck is supple.  The carotids reveal no bruits.  The jugular venous pressure is elevated. The  thyroid is not enlarged.  There is no lymphadenopathy.  The chest reveals a few fine inspiratory rales at the right base.   Expansion of the chest is symmetrical.  The precordium is quiet.  The first heart sound is normal.  The second heart sound is physiologically split.  There is a grade 2/6 decrescendo murmur of aortic insufficiency at the left sternal edge. There is no abnormal lift or heave.  The abdomen is soft and nontender.  The bowel sounds are normal.  The liver and spleen are not enlarged.  There are no abdominal masses.  There are no abdominal bruits.  Extremities reveal good pedal pulses.  There is moderate bilateral pretibial and pedal edema.  There is no cyanosis or clubbing.  Strength is normal and symmetrical in all extremities.  There is no lateralizing weakness.  There are no sensory deficits.  The skin is warm and dry.  There is no rash.  EKG shows sinus bradycardia with left bundle branch block and left axis deviation and is unchanged since 04/14/12  Assessment / Plan: 1. aneurysm of the ascending aorta with known murmur  of aortic insufficiency 2. tachybradycardia syndrome with functioning pacemaker implanted 06/23/10 at [facility]. 3. chronic diastolic heart failure with increased shortness of breath and peripheral edema.  Last echocardiogram 08/18/09 showed normal systolic function with grade 1 diastolic dysfunction. 4. left bundle-branch block 5. ischemic heart disease status post angioplasty of LAD in 1994 6. frequent urinary tract infections 7. osteoarthritis and low back pain  Plan: We will start furosemide 20 mg tablets and she will take just half a tablet a day for gentle diuresis.  Continue other medicines the same.  She may also use a trial of sublingual nitroglycerin to see if it helps chest pressure if the chest pressure is not relieved with diuresis. Recheck here in 6 weeks for followup office visit and basal metabolic panel.

## 2013-07-20 NOTE — Assessment & Plan Note (Signed)
The patient has a history of dyspnea on exertion this has increased recently.  She is also been having evidence of increased peripheral edema.  She is not presently on any diuretic.  She is trying to avoid dietary salt.  We will add a low dose of furosemide 20 mg tablets taking half a tablet daily.

## 2013-08-23 ENCOUNTER — Telehealth: Payer: Self-pay | Admitting: Internal Medicine

## 2013-08-23 NOTE — Telephone Encounter (Signed)
Written prescription for hydrocodone/APAP 5/325 ( #120) mailed August 13

## 2013-08-29 ENCOUNTER — Encounter: Payer: Self-pay | Admitting: Cardiology

## 2013-08-29 ENCOUNTER — Ambulatory Visit (INDEPENDENT_AMBULATORY_CARE_PROVIDER_SITE_OTHER): Payer: Medicare Other | Admitting: Cardiology

## 2013-08-29 VITALS — BP 138/45 | HR 54 | Ht 66.0 in | Wt 96.8 lb

## 2013-08-29 DIAGNOSIS — I2510 Atherosclerotic heart disease of native coronary artery without angina pectoris: Secondary | ICD-10-CM

## 2013-08-29 DIAGNOSIS — I447 Left bundle-branch block, unspecified: Secondary | ICD-10-CM

## 2013-08-29 DIAGNOSIS — R609 Edema, unspecified: Secondary | ICD-10-CM

## 2013-08-29 DIAGNOSIS — Z950 Presence of cardiac pacemaker: Secondary | ICD-10-CM

## 2013-08-29 DIAGNOSIS — I351 Nonrheumatic aortic (valve) insufficiency: Secondary | ICD-10-CM

## 2013-08-29 DIAGNOSIS — I359 Nonrheumatic aortic valve disorder, unspecified: Secondary | ICD-10-CM

## 2013-08-29 DIAGNOSIS — I495 Sick sinus syndrome: Secondary | ICD-10-CM

## 2013-08-29 DIAGNOSIS — I2583 Coronary atherosclerosis due to lipid rich plaque: Secondary | ICD-10-CM

## 2013-08-29 LAB — BASIC METABOLIC PANEL
BUN: 37 mg/dL — ABNORMAL HIGH (ref 6–23)
CO2: 31 meq/L (ref 19–32)
Calcium: 9.2 mg/dL (ref 8.4–10.5)
Chloride: 100 mEq/L (ref 96–112)
Creatinine, Ser: 1 mg/dL (ref 0.4–1.2)
GFR: 54.9 mL/min — ABNORMAL LOW (ref >60.00)
GLUCOSE: 90 mg/dL (ref 70–99)
POTASSIUM: 4.8 meq/L (ref 3.5–5.1)
Sodium: 138 mEq/L (ref 135–145)

## 2013-08-29 NOTE — Patient Instructions (Signed)
LAB WORK TODAY; BMET  Your physician wants you to follow-up in: [place]. You will receive a reminder letter in the mail two months in advance. If you don't receive a letter, please call our office to schedule the follow-up appointment.

## 2013-08-29 NOTE — Assessment & Plan Note (Signed)
She has a functioning pacemaker.  She has not been aware of any racing of her heart.

## 2013-08-29 NOTE — Progress Notes (Signed)
Felicia [name] Date of Birth:  1912-08-06 [place] [address] [place],   [address] [phone]        Fax   [phone]   History of Present Illness: This pleasant 80 year old woman is seen for a six-week followup office visit. She has a remote history of paroxysmal atrial fibrillation. She does have an underlying pacemaker which was implanted for bradycardia on 06/23/10 at [facility]. She has not been experiencing any chest pain or angina. She does have a history of ischemic heart disease and had prior angioplasty to the LAD in 1994. She has a known ascending aortic aneurysm and a known murmur of aortic insufficiency. Because of her age and comorbidities, these problems are being managed medically.  At her last visit she showed evidence of volume overload with peripheral edema and increasing exertional dyspnea.  We started her on furosemide 10 mg daily.  She feels that it has helped her.  She has lost 6 pounds through diuresis.  Her edema has improved and her dyspnea has improved. Patient still lives by herself.  She has helpers for a few hours several days a week.  She gets Meals on Wheels.  She stays by herself at night.  Current Outpatient Prescriptions  Medication Sig Dispense Refill  . ALPRAZolam (XANAX) 0.5 MG tablet TAKE 1/2 TO 1 TABLET BY MOUTH TWICE DAILY  60 tablet  5  . aspirin EC 81 MG tablet Take 81 mg by mouth daily.      . ciprofloxacin (CIPRO) 250 MG tablet Take 250 mg by mouth as needed.      . ferrous sulfate 325 (65 FE) MG tablet Take 325 mg by mouth daily with breakfast.        . furosemide (LASIX) 20 MG tablet 1/2 TABLET DAILY  45 tablet  1  . HYDROcodone-acetaminophen (NORCO/VICODIN) 5-325 MG per tablet TAKE ONE TABLET BY MOUTH EVERY TWELVE HOURS AS NEEDED FOR PAIN  120 tablet  0  . isosorbide mononitrate (IMDUR) 60 MG 24 hr tablet TAKE 1/2 TABLET ONCE DAILY  45 tablet  3  . lisinopril (PRINIVIL,ZESTRIL) 20 MG tablet TAKE ONE TABLET  BY MOUTH ONCE DAILY  90 tablet  3  . lovastatin (MEVACOR) 20 MG tablet TAKE ONE TABLET BY MOUTH ONCE DAILY  30 tablet  11  . metoprolol tartrate (LOPRESSOR) 25 MG tablet TAKE 1/2 TABLET BY MOUTH TWICE DAILY  90 tablet  3  . Multiple Vitamin (MULTIVITAMIN) capsule Take 1 capsule by mouth daily.        . Multiple Vitamins-Minerals (OCUVITE ADULT 50+ PO) Take by mouth 2 (two) times daily at 10 AM and 5 PM.        . NON FORMULARY Vit b complete 1x day      . Vitamin D, Ergocalciferol, (DRISDOL) 50000 UNITS CAPS TAKE ONE CAPSULE BY MOUTH ONCE A WEEK FOR 12 WEEKS.  8 capsule  0   No current facility-administered medications for this visit.    Allergies  Allergen Reactions  . Sulfa Antibiotics Nausea Only    Patient Active Problem List   Diagnosis Date Noted  . Cardiac pacemaker 07/28/2010    Priority: High  . Aortic insufficiency 06/17/2010    Priority: High  . Peripheral edema 07/20/2013  . Dyspnea on exertion 07/20/2013  . Tachycardia-bradycardia 09/15/2012  . PAF (paroxysmal atrial fibrillation) 01/26/2011  . History of compression fracture of vertebral column 01/12/2011  . HTN (hypertension) 06/17/2010  . Coronary disease 06/17/2010  .  Other and unspecified hyperlipidemia 06/17/2010  . Cholesteatoma of attic of right ear 06/17/2010  . Frequent UTI 06/17/2010  . Macular degeneration 06/17/2010  . Osteoporosis 06/17/2010  . Osteoarthritis 06/17/2010  . Squamous cell carcinoma of face 06/17/2010  . Anemia 06/17/2010  . Aneurysm of ascending aorta 06/17/2010    History  Smoking status  . Never Smoker   Smokeless tobacco  . Never Used    History  Alcohol Use No    Family History  Problem Relation Age of Onset  . Heart attack    . Heart attack Father   . Diabetes Son     Review of Systems: Constitutional: no fever chills diaphoresis or fatigue or change in weight.  Head and neck: no hearing loss, no epistaxis, no photophobia or visual disturbance. Respiratory: No  cough, shortness of breath or wheezing. Cardiovascular: No chest pain peripheral edema, palpitations. Gastrointestinal: No abdominal distention, no abdominal pain, no change in bowel habits hematochezia or melena. Genitourinary: No dysuria, no frequency, no urgency, no nocturia. Musculoskeletal:No arthralgias, no back pain, no gait disturbance or myalgias. Neurological: No dizziness, no headaches, no numbness, no seizures, no syncope, no weakness, no tremors. Hematologic: No lymphadenopathy, no easy bruising. Psychiatric: No confusion, no hallucinations, no sleep disturbance.    Physical Exam: Filed Vitals:   08/29/13 1417  BP: 138/45  Pulse: 54   the general appearance reveals a thin elderly woman in no acute distress.  Her granddaughter is with her today.The head and neck exam reveals pupils equal and reactive.  Extraocular movements are full.  There is no scleral icterus.  The mouth and pharynx are normal.  The neck is supple.  The carotids reveal no bruits.  The jugular venous pressure is normal.  The  thyroid is not enlarged.  There is no lymphadenopathy.  The chest is clear to percussion and auscultation.  There are no rales or rhonchi.  Expansion of the chest is symmetrical.  The precordium is quiet.  The first heart sound is normal.  The second heart sound is physiologically split.  There is a grade 2/6 systolic ejection murmur at the base and a grade 3/6 decrescendo murmur of aortic insufficiency loudest along the right sternal edge. There is no abnormal lift or heave.  The abdomen is soft and nontender.  The bowel sounds are normal.  The liver and spleen are not enlarged.  There are no abdominal masses.  There are no abdominal bruits.  Extremities reveal good pedal pulses.  There is no phlebitis or edema.  There is no cyanosis or clubbing.  Strength is normal and symmetrical in all extremities.  There is no lateralizing weakness.  There are no sensory deficits.  The skin is warm and dry.   There is no rash.     Assessment / Plan:  1. aneurysm of the ascending aorta with known murmur of aortic insufficiency  2. tachybradycardia syndrome with functioning pacemaker implanted 06/23/10 at [facility].  3. chronic diastolic heart failure with increased shortness of breath and peripheral edema. Last echocardiogram 08/18/09 showed normal systolic function with grade 1 diastolic dysfunction.  4. left bundle-branch block  5. ischemic heart disease status post angioplasty of LAD in 1994  6. frequent urinary tract infections  7. osteoarthritis and low back pain  Plan: Continue current medication and recheck in 6 months for followup office and EKG

## 2013-08-29 NOTE — Assessment & Plan Note (Signed)
She has not been expressing any chest pain or angina

## 2013-08-29 NOTE — Progress Notes (Signed)
Quick Note:  Please report to patient. The recent labs are stable. Continue same medication and careful diet. Potassium normal. Kidneys slightly dry. Drink more water. ______

## 2013-08-29 NOTE — Assessment & Plan Note (Signed)
The patient has a prominent murmur of aortic insufficiency being treated medically.  Her symptoms of fluid overload have responded to low-dose Lasix.  She is not having any dizzy spells or leg cramps

## 2013-09-01 ENCOUNTER — Telehealth: Payer: Self-pay | Admitting: *Deleted

## 2013-09-01 NOTE — Telephone Encounter (Signed)
granddaughter who is care giver is aware of lab results for pt with verbal understanding to results and to have pt increase H20 intake due to kidneys slightly dry.

## 2013-09-25 ENCOUNTER — Encounter: Payer: Medicare Other | Admitting: Internal Medicine

## 2013-10-03 ENCOUNTER — Other Ambulatory Visit: Payer: Self-pay | Admitting: Internal Medicine

## 2013-10-03 NOTE — Telephone Encounter (Signed)
Call and refill x 6 months

## 2013-10-16 ENCOUNTER — Encounter: Payer: Self-pay | Admitting: Internal Medicine

## 2013-10-16 ENCOUNTER — Ambulatory Visit (INDEPENDENT_AMBULATORY_CARE_PROVIDER_SITE_OTHER): Payer: Medicare Other | Admitting: Internal Medicine

## 2013-10-16 VITALS — BP 142/84 | HR 56 | Ht 66.0 in | Wt 100.1 lb

## 2013-10-16 DIAGNOSIS — I2510 Atherosclerotic heart disease of native coronary artery without angina pectoris: Secondary | ICD-10-CM

## 2013-10-16 DIAGNOSIS — I480 Paroxysmal atrial fibrillation: Secondary | ICD-10-CM

## 2013-10-16 DIAGNOSIS — I495 Sick sinus syndrome: Secondary | ICD-10-CM

## 2013-10-16 LAB — MDC_IDC_ENUM_SESS_TYPE_INCLINIC
Date Time Interrogation Session: 20151005040000
Lead Channel Impedance Value: 500 Ohm
Lead Channel Pacing Threshold Amplitude: 0.8 V
Lead Channel Pacing Threshold Pulse Width: 0.5 ms
Lead Channel Sensing Intrinsic Amplitude: 12 mV
Lead Channel Setting Pacing Amplitude: 2.4 V
Lead Channel Setting Pacing Pulse Width: 0.5 ms
MDC IDC PG SERIAL: 166745
MDC IDC SET LEADCHNL RV SENSING SENSITIVITY: 2.5 mV
MDC IDC STAT BRADY RV PERCENT PACED: 1 %

## 2013-10-16 NOTE — Patient Instructions (Signed)
Your physician wants you to follow-up in: 12 months with Dr Allred You will receive a reminder letter in the mail two months in advance. If you don't receive a letter, please call our office to schedule the follow-up appointment.  

## 2013-10-16 NOTE — Progress Notes (Signed)
Felicia Showers, MD: Primary Cardiologist:  Dr Lequita Halt is a 80 y.o. 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   The patient reports doing very well since her last visit.   Today, she  denies symptoms of palpitations, chest pain, orthopnea, PND, lower extremity edema, presyncope, syncope, or neurologic sequela.  She has chronic but mild SOB and dizziness.  The patientis tolerating medications without difficulties and is otherwise without complaint today.   Past Medical History  Diagnosis Date  . Hypertension   . Hyperlipidemia   . Arthritis   . Macular degeneration   . Aortic insufficiency   . Vitamin D deficiency   . Coronary artery disease   . LBBB (left bundle branch block)   . Ascending aortic aneurysm   . Hip fracture   . Paroxysmal atrial fibrillation   . Tachycardia-bradycardia     s/pPPM   Past Surgical History  Procedure Laterality Date  . Appendectomy    . Abdominal hysterectomy    . Fracture surgery      left hip  . Eye surgery      cataracts  . Coronary artery bypass graft  11/11/1992    EF 70%  . Transthoracic echocardiogram  08/18/2009    EF 60-65%  . US echocardiography  07/22/2009    EF 55-60%  . Transthoracic echocardiogram  05/02/2009    EF 45-50%  . US echocardiography  07/18/2008    EF 55-60%  . US echocardiography  07/14/2007    EF  55-60%  . US echocardiography  06/09/2006    EF 55-60%  . Cardiovascular stress test  08/21/2002    EF 65%, NO ISCHEMIA  . Pacemaker insertion  06/2010    [place] 60 VVI pacemaker implanted by Dr Willis Modena at [facility] for tachy/brady syndrome    History   Social History  . Marital Status: Widowed    Spouse Name: N/A    Number of Children: N/A  . Years of Education: N/A   Occupational History  . Not on file.   Social History Main Topics  . Smoking status: Never Smoker   .  Smokeless tobacco: Never Used  . Alcohol Use: No  . Drug Use: Not on file  . Sexual Activity: Not on file   Other Topics Concern  . Not on file   Social History Narrative  . No narrative on file    Family History  Problem Relation Age of Onset  . Heart attack    . Heart attack Father   . Diabetes Son     Allergies  Allergen Reactions  . Sulfa Antibiotics Nausea Only    Current Outpatient Prescriptions  Medication Sig Dispense Refill  . ALPRAZolam (XANAX) 0.5 MG tablet TAKE 1/2 TO 1 TABLET BY MOUTH TWICE DAILY  60 tablet  5  . aspirin EC 81 MG tablet Take 81 mg by mouth daily.      . ciprofloxacin (CIPRO) 250 MG tablet Take 250 mg by mouth as needed (UTI).       . ferrous sulfate 325 (65 FE) MG tablet Take 325 mg by mouth daily with breakfast.        . furosemide (LASIX) 20 MG tablet 1/2 TABLET DAILY  45 tablet  1  . HYDROcodone-acetaminophen (NORCO/VICODIN) 5-325 MG per tablet TAKE ONE TABLET BY MOUTH EVERY TWELVE HOURS AS NEEDED FOR PAIN  120 tablet  0  . isosorbide mononitrate (IMDUR) 60 MG 24 hr tablet TAKE 1/2 TABLET ONCE DAILY  45 tablet  3  . lisinopril (PRINIVIL,ZESTRIL) 20 MG tablet TAKE ONE TABLET BY MOUTH ONCE DAILY  90 tablet  3  . lovastatin (MEVACOR) 20 MG tablet TAKE ONE TABLET BY MOUTH ONCE DAILY  30 tablet  11  . metoprolol tartrate (LOPRESSOR) 25 MG tablet TAKE 1/2 TABLET BY MOUTH TWICE DAILY  90 tablet  3  . Multiple Vitamin (MULTIVITAMIN) capsule Take 1 capsule by mouth daily.        . Multiple Vitamins-Minerals (OCUVITE ADULT 50+ PO) Take 1 capsule by mouth 2 (two) times daily at 10 AM and 5 PM.       . NON FORMULARY Vit b complete 1x day      . Vitamin D, Ergocalciferol, (DRISDOL) 50000 UNITS CAPS TAKE ONE CAPSULE BY MOUTH ONCE A WEEK FOR 12 WEEKS.  8 capsule  0   No current facility-administered medications for this visit.    ROS- + leg weakness, and mild edema, recently improved with lasix, all other systems are reviewed and negative except as per  HPI  Physical Exam: Filed Vitals:   10/16/13 1616  BP: 142/84  Pulse: 56  Height: 5\' 6"  (1.676 m)  Weight: 100 lb 1.9 oz (45.414 kg)    GEN- The patient is elderly appearing, alert and oriented x 3 today.   Head- normocephalic, atraumatic Eyes-  Sclera clear, conjunctiva pink Ears- hearing intact Oropharynx- clear Neck- supple, no JVP Lymph- no cervical lymphadenopathy Lungs- Clear to ausculation bilaterally, normal work of breathing Chest- pacemaker pocket is well healed Heart- Regular rate and rhythm  GI- soft, NT, ND, + BS Extremities- no clubbing, cyanosis, or edema MS- age appropriate atrophy Skin- no rash or lesion Psych- euthymic mood, full affect Neuro- strength and sensation are intact  Pacemaker interrogation- reviewed in detail today,  See PACEART report  Assessment and Plan:  1. Tachy/brady syndrome Normal pacemaker function See Pace Art report No changes today  2. Persistent atrial fibrillation CHADS2VASC score of at least 4.  She and her daughter decline anticoagulation at this time.  Return to the device clinic to see the device NP in 1 year

## 2013-10-19 ENCOUNTER — Telehealth: Payer: Self-pay

## 2013-10-19 MED ORDER — HYDROCODONE-ACETAMINOPHEN 5-325 MG PO TABS: ORAL_TABLET | ORAL | Status: DC

## 2013-10-19 NOTE — Telephone Encounter (Signed)
Hydrocodone rx and prevnar 13 rx mailed to patient.  Patient aware.

## 2013-10-19 NOTE — Telephone Encounter (Signed)
Patient called requesting hydrocodone refill.  Patient also had pneumonia vaccine in 2001, she was wondering if she needed a booster.  If so can we send an rx for that as well.  Please advise.

## 2013-10-19 NOTE — Telephone Encounter (Signed)
Only needs Prevnar 13. Has had pneumovax in 2001.Refill Hydrocodone per previous Rx for back pain

## 2013-10-27 ENCOUNTER — Encounter: Payer: Self-pay | Admitting: Internal Medicine

## 2013-11-29 ENCOUNTER — Ambulatory Visit (INDEPENDENT_AMBULATORY_CARE_PROVIDER_SITE_OTHER): Payer: Medicare Other | Admitting: Ophthalmology

## 2013-11-29 DIAGNOSIS — H35033 Hypertensive retinopathy, bilateral: Secondary | ICD-10-CM

## 2013-11-29 DIAGNOSIS — I10 Essential (primary) hypertension: Secondary | ICD-10-CM

## 2013-11-29 DIAGNOSIS — H3531 Nonexudative age-related macular degeneration: Secondary | ICD-10-CM

## 2013-11-29 DIAGNOSIS — H43813 Vitreous degeneration, bilateral: Secondary | ICD-10-CM

## 2013-12-15 ENCOUNTER — Other Ambulatory Visit: Payer: Self-pay

## 2013-12-15 DIAGNOSIS — R0609 Other forms of dyspnea: Principal | ICD-10-CM

## 2013-12-15 MED ORDER — FUROSEMIDE 20 MG PO TABS
ORAL_TABLET | ORAL | Status: DC
Start: 2013-12-15 — End: 2014-02-15

## 2013-12-22 ENCOUNTER — Telehealth: Payer: Self-pay | Admitting: Internal Medicine

## 2013-12-22 MED ORDER — HYDROCODONE-ACETAMINOPHEN 5-325 MG PO TABS: ORAL_TABLET | ORAL | Status: DC

## 2013-12-22 NOTE — Telephone Encounter (Signed)
90 day supply of hydrocodone printed and mailed to patient.

## 2013-12-22 NOTE — Telephone Encounter (Signed)
Needs a refill on her Norco/Vicodin prescription.  She uses 5-325 mg.  She takes 1 in the a.m and 1 in the p.m.  She would like [place] to mail this to her TODAY.    Thanks.

## 2013-12-22 NOTE — Telephone Encounter (Signed)
Please refill for 90 days

## 2013-12-28 ENCOUNTER — Encounter: Payer: Self-pay | Admitting: Internal Medicine

## 2013-12-28 ENCOUNTER — Ambulatory Visit (INDEPENDENT_AMBULATORY_CARE_PROVIDER_SITE_OTHER): Payer: Medicare Other | Admitting: Internal Medicine

## 2013-12-28 VITALS — BP 144/82 | HR 63 | Temp 97.6°F | Wt 100.0 lb

## 2013-12-28 DIAGNOSIS — I10 Essential (primary) hypertension: Secondary | ICD-10-CM

## 2013-12-28 DIAGNOSIS — N390 Urinary tract infection, site not specified: Secondary | ICD-10-CM

## 2013-12-28 DIAGNOSIS — D649 Anemia, unspecified: Secondary | ICD-10-CM

## 2013-12-28 DIAGNOSIS — E785 Hyperlipidemia, unspecified: Secondary | ICD-10-CM

## 2013-12-28 DIAGNOSIS — I2510 Atherosclerotic heart disease of native coronary artery without angina pectoris: Secondary | ICD-10-CM

## 2013-12-28 LAB — CBC WITH DIFFERENTIAL/PLATELET
BASOS ABS: 0 10*3/uL (ref 0.0–0.1)
BASOS PCT: 1 % (ref 0–1)
EOS PCT: 4 % (ref 0–5)
Eosinophils Absolute: 0.2 10*3/uL (ref 0.0–0.7)
HCT: 32.6 % — ABNORMAL LOW (ref 36.0–46.0)
Hemoglobin: 10.2 g/dL — ABNORMAL LOW (ref 12.0–15.0)
Lymphocytes Relative: 24 % (ref 12–46)
Lymphs Abs: 1.1 10*3/uL (ref 0.7–4.0)
MCH: 28.6 pg (ref 26.0–34.0)
MCHC: 31.3 g/dL (ref 30.0–36.0)
MCV: 91.3 fL (ref 78.0–100.0)
MPV: 10.4 fL (ref 9.4–12.4)
Monocytes Absolute: 0.2 10*3/uL (ref 0.1–1.0)
Monocytes Relative: 5 % (ref 3–12)
NEUTROS PCT: 66 % (ref 43–77)
Neutro Abs: 2.9 10*3/uL (ref 1.7–7.7)
PLATELETS: 199 10*3/uL (ref 150–400)
RBC: 3.57 MIL/uL — ABNORMAL LOW (ref 3.87–5.11)
RDW: 14.8 % (ref 11.5–15.5)
WBC: 4.4 10*3/uL (ref 4.0–10.5)

## 2013-12-28 LAB — POCT URINALYSIS DIPSTICK
BILIRUBIN UA: NEGATIVE
Glucose, UA: NEGATIVE
Ketones, UA: NEGATIVE
Nitrite, UA: NEGATIVE
PH UA: 6.5
Protein, UA: POSITIVE
Spec Grav, UA: 1.01
UROBILINOGEN UA: NEGATIVE

## 2013-12-29 LAB — COMPREHENSIVE METABOLIC PANEL
ALBUMIN: 3.8 g/dL (ref 3.5–5.2)
ALT: 9 U/L (ref 0–35)
AST: 17 U/L (ref 0–37)
Alkaline Phosphatase: 48 U/L (ref 39–117)
BILIRUBIN TOTAL: 0.6 mg/dL (ref 0.2–1.2)
BUN: 38 mg/dL — ABNORMAL HIGH (ref 6–23)
CHLORIDE: 100 meq/L (ref 96–112)
CO2: 29 mEq/L (ref 19–32)
CREATININE: 0.81 mg/dL (ref 0.50–1.10)
Calcium: 9.4 mg/dL (ref 8.4–10.5)
GLUCOSE: 88 mg/dL (ref 70–99)
Potassium: 5 mEq/L (ref 3.5–5.3)
Sodium: 135 mEq/L (ref 135–145)
Total Protein: 6.8 g/dL (ref 6.0–8.3)

## 2013-12-29 LAB — HEPATIC FUNCTION PANEL
ALBUMIN: 3.8 g/dL (ref 3.5–5.2)
ALK PHOS: 48 U/L (ref 39–117)
ALT: 9 U/L (ref 0–35)
AST: 17 U/L (ref 0–37)
BILIRUBIN DIRECT: 0.2 mg/dL (ref 0.0–0.3)
BILIRUBIN INDIRECT: 0.4 mg/dL (ref 0.2–1.2)
Total Bilirubin: 0.6 mg/dL (ref 0.2–1.2)
Total Protein: 6.8 g/dL (ref 6.0–8.3)

## 2013-12-29 LAB — LIPID PANEL
Cholesterol: 145 mg/dL (ref 0–200)
HDL: 66 mg/dL (ref >39)
LDL CALC: 65 mg/dL (ref 0–99)
TRIGLYCERIDES: 68 mg/dL (ref <150)
Total CHOL/HDL Ratio: 2.2 Ratio
VLDL: 14 mg/dL (ref 0–40)

## 2013-12-29 LAB — URINALYSIS, MICROSCOPIC ONLY
CASTS: NONE SEEN
Crystals: NONE SEEN

## 2013-12-31 LAB — URINE CULTURE

## 2014-01-01 ENCOUNTER — Telehealth: Payer: Self-pay | Admitting: *Deleted

## 2014-01-01 MED ORDER — AMOXICILLIN 500 MG PO CAPS: 500.0000 mg | ORAL_CAPSULE | Freq: Three times a day (TID) | ORAL | Status: DC

## 2014-01-01 NOTE — Telephone Encounter (Signed)
Patient notified regarding urinalysis results informed patient script for antibiotic sent to pharmacy instructions reviewed . Patient verbalized understanding.

## 2014-02-12 ENCOUNTER — Telehealth: Payer: Self-pay | Admitting: *Deleted

## 2014-02-12 ENCOUNTER — Telehealth: Payer: Self-pay | Admitting: Cardiology

## 2014-02-12 ENCOUNTER — Emergency Department (HOSPITAL_COMMUNITY): Payer: Medicare Other

## 2014-02-12 ENCOUNTER — Inpatient Hospital Stay (HOSPITAL_COMMUNITY)
Admission: EM | Admit: 2014-02-12 | Discharge: 2014-02-15 | DRG: 291 | Disposition: A | Discharge status: Skilled Nursing Facility | Payer: Medicare Other | Attending: Family Medicine | Admitting: Family Medicine

## 2014-02-12 ENCOUNTER — Encounter (HOSPITAL_COMMUNITY): Payer: Self-pay | Admitting: Emergency Medicine

## 2014-02-12 DIAGNOSIS — I351 Nonrheumatic aortic (valve) insufficiency: Secondary | ICD-10-CM | POA: Diagnosis present

## 2014-02-12 DIAGNOSIS — Z881 Allergy status to other antibiotic agents status: Secondary | ICD-10-CM

## 2014-02-12 DIAGNOSIS — M7989 Other specified soft tissue disorders: Secondary | ICD-10-CM | POA: Diagnosis not present

## 2014-02-12 DIAGNOSIS — I272 Other secondary pulmonary hypertension: Secondary | ICD-10-CM | POA: Diagnosis present

## 2014-02-12 DIAGNOSIS — Z79891 Long term (current) use of opiate analgesic: Secondary | ICD-10-CM | POA: Diagnosis not present

## 2014-02-12 DIAGNOSIS — Z79899 Other long term (current) drug therapy: Secondary | ICD-10-CM | POA: Diagnosis not present

## 2014-02-12 DIAGNOSIS — I5033 Acute on chronic diastolic (congestive) heart failure: Secondary | ICD-10-CM | POA: Diagnosis present

## 2014-02-12 DIAGNOSIS — E559 Vitamin D deficiency, unspecified: Secondary | ICD-10-CM | POA: Diagnosis present

## 2014-02-12 DIAGNOSIS — I509 Heart failure, unspecified: Secondary | ICD-10-CM

## 2014-02-12 DIAGNOSIS — Z681 Body mass index (BMI) 19 or less, adult: Secondary | ICD-10-CM

## 2014-02-12 DIAGNOSIS — H3530 Unspecified macular degeneration: Secondary | ICD-10-CM | POA: Diagnosis present

## 2014-02-12 DIAGNOSIS — Z951 Presence of aortocoronary bypass graft: Secondary | ICD-10-CM

## 2014-02-12 DIAGNOSIS — R0602 Shortness of breath: Secondary | ICD-10-CM

## 2014-02-12 DIAGNOSIS — R0609 Other forms of dyspnea: Secondary | ICD-10-CM | POA: Diagnosis not present

## 2014-02-12 DIAGNOSIS — I071 Rheumatic tricuspid insufficiency: Secondary | ICD-10-CM | POA: Diagnosis present

## 2014-02-12 DIAGNOSIS — Z950 Presence of cardiac pacemaker: Secondary | ICD-10-CM

## 2014-02-12 DIAGNOSIS — I129 Hypertensive chronic kidney disease with stage 1 through stage 4 chronic kidney disease, or unspecified chronic kidney disease: Secondary | ICD-10-CM | POA: Diagnosis present

## 2014-02-12 DIAGNOSIS — I712 Thoracic aortic aneurysm, without rupture: Secondary | ICD-10-CM | POA: Diagnosis present

## 2014-02-12 DIAGNOSIS — I472 Ventricular tachycardia: Secondary | ICD-10-CM | POA: Diagnosis present

## 2014-02-12 DIAGNOSIS — Z833 Family history of diabetes mellitus: Secondary | ICD-10-CM | POA: Diagnosis not present

## 2014-02-12 DIAGNOSIS — I471 Supraventricular tachycardia: Secondary | ICD-10-CM | POA: Diagnosis present

## 2014-02-12 DIAGNOSIS — N184 Chronic kidney disease, stage 4 (severe): Secondary | ICD-10-CM | POA: Diagnosis present

## 2014-02-12 DIAGNOSIS — I495 Sick sinus syndrome: Secondary | ICD-10-CM | POA: Diagnosis present

## 2014-02-12 DIAGNOSIS — E785 Hyperlipidemia, unspecified: Secondary | ICD-10-CM | POA: Diagnosis present

## 2014-02-12 DIAGNOSIS — Z955 Presence of coronary angioplasty implant and graft: Secondary | ICD-10-CM | POA: Diagnosis not present

## 2014-02-12 DIAGNOSIS — I482 Chronic atrial fibrillation, unspecified: Secondary | ICD-10-CM | POA: Diagnosis present

## 2014-02-12 DIAGNOSIS — I2781 Cor pulmonale (chronic): Secondary | ICD-10-CM | POA: Diagnosis present

## 2014-02-12 DIAGNOSIS — I10 Essential (primary) hypertension: Secondary | ICD-10-CM | POA: Diagnosis present

## 2014-02-12 DIAGNOSIS — M1990 Unspecified osteoarthritis, unspecified site: Secondary | ICD-10-CM | POA: Diagnosis present

## 2014-02-12 DIAGNOSIS — I2510 Atherosclerotic heart disease of native coronary artery without angina pectoris: Secondary | ICD-10-CM | POA: Diagnosis not present

## 2014-02-12 DIAGNOSIS — Z7982 Long term (current) use of aspirin: Secondary | ICD-10-CM

## 2014-02-12 DIAGNOSIS — D649 Anemia, unspecified: Secondary | ICD-10-CM | POA: Diagnosis present

## 2014-02-12 DIAGNOSIS — R609 Edema, unspecified: Secondary | ICD-10-CM | POA: Diagnosis not present

## 2014-02-12 DIAGNOSIS — E43 Unspecified severe protein-calorie malnutrition: Secondary | ICD-10-CM | POA: Insufficient documentation

## 2014-02-12 DIAGNOSIS — Z792 Long term (current) use of antibiotics: Secondary | ICD-10-CM | POA: Diagnosis not present

## 2014-02-12 DIAGNOSIS — I480 Paroxysmal atrial fibrillation: Secondary | ICD-10-CM | POA: Diagnosis present

## 2014-02-12 DIAGNOSIS — Z8249 Family history of ischemic heart disease and other diseases of the circulatory system: Secondary | ICD-10-CM | POA: Diagnosis not present

## 2014-02-12 LAB — URINALYSIS, ROUTINE W REFLEX MICROSCOPIC
Bilirubin Urine: NEGATIVE
Glucose, UA: NEGATIVE mg/dL
HGB URINE DIPSTICK: NEGATIVE
KETONES UR: NEGATIVE mg/dL
NITRITE: NEGATIVE
PROTEIN: NEGATIVE mg/dL
Specific Gravity, Urine: 1.012 (ref 1.005–1.030)
UROBILINOGEN UA: 0.2 mg/dL (ref 0.0–1.0)
pH: 5 (ref 5.0–8.0)

## 2014-02-12 LAB — COMPREHENSIVE METABOLIC PANEL
ALT: 12 U/L (ref 0–35)
AST: 20 U/L (ref 0–37)
Albumin: 3.3 g/dL — ABNORMAL LOW (ref 3.5–5.2)
Alkaline Phosphatase: 67 U/L (ref 39–117)
Anion gap: 7 (ref 5–15)
BILIRUBIN TOTAL: 0.7 mg/dL (ref 0.3–1.2)
BUN: 42 mg/dL — ABNORMAL HIGH (ref 6–23)
CALCIUM: 8.7 mg/dL (ref 8.4–10.5)
CO2: 29 mmol/L (ref 19–32)
CREATININE: 0.83 mg/dL (ref 0.50–1.10)
Chloride: 102 mmol/L (ref 96–112)
GFR calc Af Amer: 65 mL/min — ABNORMAL LOW (ref >90)
GFR calc non Af Amer: 56 mL/min — ABNORMAL LOW (ref >90)
GLUCOSE: 95 mg/dL (ref 70–99)
POTASSIUM: 4.4 mmol/L (ref 3.5–5.1)
SODIUM: 138 mmol/L (ref 135–145)
TOTAL PROTEIN: 6.4 g/dL (ref 6.0–8.3)

## 2014-02-12 LAB — CBC WITH DIFFERENTIAL/PLATELET
BASOS ABS: 0 10*3/uL (ref 0.0–0.1)
BASOS PCT: 0 % (ref 0–1)
EOS PCT: 3 % (ref 0–5)
Eosinophils Absolute: 0.1 10*3/uL (ref 0.0–0.7)
HEMATOCRIT: 30.1 % — AB (ref 36.0–46.0)
Hemoglobin: 9.3 g/dL — ABNORMAL LOW (ref 12.0–15.0)
LYMPHS ABS: 0.6 10*3/uL — AB (ref 0.7–4.0)
LYMPHS PCT: 11 % — AB (ref 12–46)
MCH: 28 pg (ref 26.0–34.0)
MCHC: 30.9 g/dL (ref 30.0–36.0)
MCV: 90.7 fL (ref 78.0–100.0)
MONO ABS: 0.4 10*3/uL (ref 0.1–1.0)
Monocytes Relative: 8 % (ref 3–12)
NEUTROS ABS: 4.1 10*3/uL (ref 1.7–7.7)
Neutrophils Relative %: 78 % — ABNORMAL HIGH (ref 43–77)
Platelets: 221 10*3/uL (ref 150–400)
RBC: 3.32 MIL/uL — ABNORMAL LOW (ref 3.87–5.11)
RDW: 14.1 % (ref 11.5–15.5)
WBC: 5.3 10*3/uL (ref 4.0–10.5)

## 2014-02-12 LAB — URINE MICROSCOPIC-ADD ON

## 2014-02-12 LAB — TSH: TSH: 1.587 u[IU]/mL (ref 0.350–4.500)

## 2014-02-12 LAB — BRAIN NATRIURETIC PEPTIDE: B Natriuretic Peptide: 838.4 pg/mL — ABNORMAL HIGH (ref 0.0–100.0)

## 2014-02-12 LAB — I-STAT TROPONIN, ED: Troponin i, poc: 0.01 ng/mL (ref 0.00–0.08)

## 2014-02-12 LAB — TROPONIN I

## 2014-02-12 LAB — D-DIMER, QUANTITATIVE: D-Dimer, Quant: 3.26 ug/mL-FEU — ABNORMAL HIGH (ref 0.00–0.48)

## 2014-02-12 MED ORDER — LEVALBUTEROL HCL 1.25 MG/0.5ML IN NEBU
1.2500 mg | INHALATION_SOLUTION | Freq: Two times a day (BID) | RESPIRATORY_TRACT | Status: DC
  Filled 2014-02-12 (×3): qty 0.5

## 2014-02-12 MED ORDER — ENOXAPARIN SODIUM 60 MG/0.6ML ~~LOC~~ SOLN
45.0000 mg | Freq: Once | SUBCUTANEOUS | Status: AC
  Administered 2014-02-12: 45 mg via SUBCUTANEOUS
  Filled 2014-02-12: qty 0.6

## 2014-02-12 MED ORDER — FUROSEMIDE 10 MG/ML IJ SOLN
40.0000 mg | Freq: Once | INTRAMUSCULAR | Status: AC
  Administered 2014-02-12: 40 mg via INTRAVENOUS
  Filled 2014-02-12: qty 4

## 2014-02-12 MED ORDER — FUROSEMIDE 10 MG/ML IJ SOLN
40.0000 mg | Freq: Once | INTRAMUSCULAR | Status: AC
  Administered 2014-02-13: 40 mg via INTRAVENOUS
  Filled 2014-02-12: qty 4

## 2014-02-12 MED ORDER — HYDROCODONE-ACETAMINOPHEN 5-325 MG PO TABS
1.0000 | ORAL_TABLET | Freq: Four times a day (QID) | ORAL | Status: DC | PRN
  Administered 2014-02-13: 1 via ORAL
  Filled 2014-02-12: qty 1

## 2014-02-12 MED ORDER — METOPROLOL TARTRATE 25 MG PO TABS
12.5000 mg | ORAL_TABLET | Freq: Two times a day (BID) | ORAL | Status: DC
  Administered 2014-02-12 – 2014-02-15 (×6): 12.5 mg via ORAL
  Filled 2014-02-12 (×6): qty 1

## 2014-02-12 MED ORDER — LEVALBUTEROL HCL 1.25 MG/0.5ML IN NEBU
1.2500 mg | INHALATION_SOLUTION | Freq: Four times a day (QID) | RESPIRATORY_TRACT | Status: DC
  Administered 2014-02-12 (×2): 1.25 mg via RESPIRATORY_TRACT
  Filled 2014-02-12 (×4): qty 0.5

## 2014-02-12 MED ORDER — ISOSORBIDE MONONITRATE ER 30 MG PO TB24
30.0000 mg | ORAL_TABLET | Freq: Every day | ORAL | Status: DC
  Administered 2014-02-13 – 2014-02-15 (×3): 30 mg via ORAL
  Filled 2014-02-12 (×3): qty 1

## 2014-02-12 MED ORDER — POTASSIUM CHLORIDE CRYS ER 20 MEQ PO TBCR
40.0000 meq | EXTENDED_RELEASE_TABLET | Freq: Once | ORAL | Status: AC
  Administered 2014-02-12: 40 meq via ORAL
  Filled 2014-02-12: qty 2

## 2014-02-12 MED ORDER — ASPIRIN EC 81 MG PO TBEC
81.0000 mg | DELAYED_RELEASE_TABLET | Freq: Every day | ORAL | Status: DC
  Administered 2014-02-13 – 2014-02-15 (×3): 81 mg via ORAL
  Filled 2014-02-12 (×3): qty 1

## 2014-02-12 MED ORDER — LEVALBUTEROL HCL 0.63 MG/3ML IN NEBU: 0.6300 mg | INHALATION_SOLUTION | RESPIRATORY_TRACT | Status: DC | PRN

## 2014-02-12 MED ORDER — PRAVASTATIN SODIUM 10 MG PO TABS
10.0000 mg | ORAL_TABLET | Freq: Every day | ORAL | Status: DC
  Administered 2014-02-13 – 2014-02-14 (×2): 10 mg via ORAL
  Filled 2014-02-12 (×3): qty 1

## 2014-02-12 MED ORDER — ALPRAZOLAM 0.25 MG PO TABS
0.2500 mg | ORAL_TABLET | Freq: Three times a day (TID) | ORAL | Status: DC | PRN
  Administered 2014-02-12: 0.25 mg via ORAL
  Filled 2014-02-12: qty 1

## 2014-02-12 NOTE — Telephone Encounter (Signed)
Agree--needs ED transport.

## 2014-02-12 NOTE — Telephone Encounter (Signed)
Patient family called patient having shortness of breath and had to take some Nitro .They stated she was struggling with shortness of breath instructed family she needed to go to hospital call 911 for emergency transport family stated they wanted to call cardiologist first. Stressed importance of getting her care as soon as possible

## 2014-02-12 NOTE — ED Provider Notes (Signed)
CSN: [id number]     Arrival date & time 02/12/14  1123 History   First MD Initiated Contact with Patient 02/12/14 1149     Chief Complaint  Patient presents with  . Shortness of Breath  . Weakness     (Consider location/radiation/quality/duration/timing/severity/associated sxs/prior Treatment) HPI Comments: Patient is a 81 year old female with history of thoracic aneurysm, CAD with stent, hypertension. She presents today with complaints of worsening shortness of breath for the past week. It is worse with exertion and ambulation and resolved with rest. She denies any fevers or chills. She denies any productive cough. She denies experiencing any chest discomfort. She also feels somewhat weak in his if she has less energy than normal. She does report swelling in both legs, however this is baseline for her.  Patient is a 81 y.o. female presenting with shortness of breath. The history is provided by the patient and a relative.  Shortness of Breath Severity:  Moderate Onset quality:  Gradual Duration:  1 week Timing:  Constant Progression:  Worsening Chronicity:  New Context: activity   Relieved by:  Nothing Worsened by:  Nothing tried Ineffective treatments:  None tried Associated symptoms: no chest pain and no fever     Past Medical History  Diagnosis Date  . Hypertension   . Hyperlipidemia   . Arthritis   . Macular degeneration   . Aortic insufficiency   . Vitamin D deficiency   . Coronary artery disease   . LBBB (left bundle branch block)   . Ascending aortic aneurysm   . Hip fracture   . Paroxysmal atrial fibrillation   . Tachycardia-bradycardia     s/pPPM   Past Surgical History  Procedure Laterality Date  . Appendectomy    . Abdominal hysterectomy    . Fracture surgery      left hip  . Eye surgery      cataracts  . Coronary artery bypass graft  11/11/1992    EF 70%  . Transthoracic echocardiogram  08/18/2009    EF 60-65%  . US echocardiography  07/22/2009    EF  55-60%  . Transthoracic echocardiogram  05/02/2009    EF 45-50%  . US echocardiography  07/18/2008    EF 55-60%  . US echocardiography  07/14/2007    EF  55-60%  . US echocardiography  06/09/2006    EF 55-60%  . Cardiovascular stress test  08/21/2002    EF 65%, NO ISCHEMIA  . Pacemaker insertion  06/2010    [place] 60 VVI pacemaker implanted by Dr Willis Modena at [facility] for tachy/brady syndrome   Family History  Problem Relation Age of Onset  . Heart attack    . Heart attack Father   . Diabetes Son    History  Substance Use Topics  . Smoking status: Never Smoker   . Smokeless tobacco: Never Used  . Alcohol Use: No   OB History    No data available     Review of Systems  Constitutional: Negative for fever.  Respiratory: Positive for shortness of breath.   Cardiovascular: Negative for chest pain.  All other systems reviewed and are negative.     Allergies  Sulfa antibiotics  Home Medications   Prior to Admission medications   Medication Sig Start Date End Date Taking? Authorizing Provider  ALPRAZolam Duanne Moron) 0.5 MG tablet TAKE 1/2 TO 1 TABLET BY MOUTH TWICE DAILY 10/03/13  Yes [name], MD  aspirin EC 81 MG tablet Take 81 mg by  mouth daily.   Yes Historical Provider, MD  B Complex Vitamins (VITAMIN B-COMPLEX PO) Take 1 tablet by mouth daily.   Yes Historical Provider, MD  Cholecalciferol (VITAMIN D-3 PO) Take 1 tablet by mouth daily.   Yes Historical Provider, MD  Ciprofloxacin-Dexamethasone (CIPRODEX OT) Place 1 drop into both eyes daily as needed (dry eyes).   Yes Historical Provider, MD  ferrous sulfate 325 (65 FE) MG tablet Take 325 mg by mouth daily with breakfast.     Yes Historical Provider, MD  furosemide (LASIX) 20 MG tablet 1/2 TABLET DAILY 12/15/13  Yes Darlin Coco, MD  HYDROcodone-acetaminophen (NORCO/VICODIN) 5-325 MG per tablet TAKE ONE TABLET BY MOUTH EVERY TWELVE HOURS AS NEEDED FOR PAIN 12/22/13  Yes [name], MD  isosorbide mononitrate  (IMDUR) 60 MG 24 hr tablet TAKE 1/2 TABLET ONCE DAILY 06/22/13  Yes [name], MD  lisinopril (PRINIVIL,ZESTRIL) 20 MG tablet TAKE ONE TABLET BY MOUTH ONCE DAILY 03/31/13  Yes [name], MD  lovastatin (MEVACOR) 20 MG tablet TAKE ONE TABLET BY MOUTH ONCE DAILY 06/26/13  Yes [name], MD  metoprolol tartrate (LOPRESSOR) 25 MG tablet TAKE 1/2 TABLET BY MOUTH TWICE DAILY 07/07/13  Yes [name], MD  Multiple Vitamin (MULTIVITAMIN) capsule Take 1 capsule by mouth daily.     Yes Historical Provider, MD  Multiple Vitamins-Minerals (OCUVITE ADULT 50+ PO) Take 1 capsule by mouth 2 (two) times daily at 10 AM and 5 PM.    Yes Historical Provider, MD  Vitamin D, Ergocalciferol, (DRISDOL) 50000 UNITS CAPS TAKE ONE CAPSULE BY MOUTH ONCE A WEEK FOR 12 WEEKS. 07/22/11  Yes [name], MD  amoxicillin (AMOXIL) 500 MG capsule Take 1 capsule (500 mg total) by mouth 3 (three) times daily. Patient not taking: Reported on 02/12/2014 01/01/14   [name], MD  ciprofloxacin (CIPRO) 250 MG tablet Take 250 mg by mouth daily as needed (UTI).  06/29/13   [name], MD   BP 144/55 mmHg  Pulse 62  Temp(Src) 98.3 F (36.8 C) (Oral)  Resp 18  SpO2 99% Physical Exam  Constitutional: She is oriented to person, place, and time. She appears well-developed and well-nourished. No distress.  HENT:  Head: Normocephalic and atraumatic.  Mouth/Throat: Oropharynx is clear and moist.  Neck: Normal range of motion. Neck supple.  Cardiovascular: Normal rate and regular rhythm.  Exam reveals no gallop and no friction rub.   No murmur heard. Pulmonary/Chest: Effort normal and breath sounds normal. No respiratory distress. She has no wheezes.  Abdominal: Soft. Bowel sounds are normal. She exhibits no distension. There is no tenderness.  Musculoskeletal: Normal range of motion. She exhibits edema.  There is 2+ pitting edema of the bilateral lower extremities.  Neurological: She is alert and oriented to person,  place, and time.  Skin: Skin is warm and dry. She is not diaphoretic.  Nursing note and vitals reviewed.   ED Course  Procedures (including critical care time) Labs Review Labs Reviewed - No data to display  Imaging Review No results found.   Date: 02/12/2014  Rate: 62  Rhythm: normal sinus rhythm  QRS Axis: normal  Intervals: normal  ST/T Wave abnormalities: normal  Conduction Disutrbances:left bundle branch block  Narrative Interpretation:   Old EKG Reviewed: none available    MDM   Final diagnoses:  None    Patient presents with complaints of shortness of breath. It has worsened over the past week. Her x-rays show bilateral  pleural effusions and elevated BNP. When ambulated, her saturations dropped to 78%. I feel as though she will require admission for diaphoresis and further observation.    Veryl Speak, MD 02/12/14 [phone]

## 2014-02-12 NOTE — Progress Notes (Signed)
   Subjective:    Patient ID: Felicia [name], female    DOB: [birth year], 81 y.o.   MRN: [id number]  HPI  81 year old White Female in today for follow-up of hypertension and hyperlipidemia. History of thoracic aortic aneurysm which is been declared inoperable due to her age and physical condition. She's doing well. She lives alone and has some help coming to the house through an agency. She is accompanied today by her granddaughter, Rise Paganini who is a Marine scientist. They have some questions about her statin medication causing some musculoskeletal pain but she's been on the same statin medication for years and I'll think that's causing musculoskeletal pain. She likely has osteoarthritis. She actually got looks very good for her age. She is alert and oriented. Carries on a good conversation. Keeps up with local activities at home.  She has a history of iron deficiency anemia that we simply treated with iron supplement and have not put her through a GI evaluation at her age. CBC checked is stable at 10.2 g. Lipid panel is normal.  She has frequent urinary tract infections and in fact may be never really clears those infections after treatment. Urinalysis today is abnormal. Culture sent.  History of osteoporosis and vertebral compression fracture for which she takes narcotic pain medications sparingly    Review of Systems     Objective:   Physical Exam Skin warm and dry. Nodes none. No JVD thyromegaly or carotid bruits. Chest clear. Cardiac exam regular rate and rhythm. Extremities without edema       Assessment & Plan:  Recurrent urinary tract infections  Hypertension  Hyperlipidemia  History of coronary disease  Thoracic aortic aneurysm  Anemia  History of vertebral compression fracture with resultant chronic back pain  Plan: Continue same medications and return in 6 months. Urine culture is pending. Takes narcotic pain medications sparingly for musculoskeletal pain  25 minutes spent with  patient and her granddaughter discussing coordination of care, evaluation and prescribing medication

## 2014-02-12 NOTE — ED Notes (Signed)
Patient transported to X-ray 

## 2014-02-12 NOTE — Telephone Encounter (Signed)
Pt c/o Shortness Of Breath: STAT if SOB developed within the last 24 hours or pt is noticeably SOB on the phone  1. Are you currently SOB (can you hear that pt is SOB on the phone)? Yes..pt's son was on the phone  2. How long have you been experiencing SOB? 3days  3. Are you SOB when sitting or when up moving around? both  4. Are you currently experiencing any other symptoms? No   Pt's son is calling and he is at work and not with the pt. Pt's son would like to be called back.

## 2014-02-12 NOTE — Progress Notes (Signed)
  CARE MANAGEMENT ED NOTE 02/12/2014  Patient:  Laminack,Dove   Account Number:  [id number]  Date Initiated:  02/12/2014  Documentation initiated by:  Livia Snellen  Subjective/Objective Assessment:   Patient presents to ED with shortness of breath for three days.     Subjective/Objective Assessment Detail:   Patient with pmhx of HTN, Hyperlipidemia, Aortic insuffinciency, LBBB, Paroxysmal Afib, Tachycardia-bradycardia.  Patient's pulso ox on room air 88%.  Patient's oxygen dropped to 78% when ambulating     Action/Plan:   Action/Plan Detail:   Anticipated DC Date:       Status Recommendation to Physician:   Result of Recommendation:    Other ED [place]  Other  PCP issues    Choice offered to / List presented to:            Status of service:  Completed, signed off  ED Comments:   ED Comments Detail:  EDCM spoke to patient at bedside.  Patient lives at home alone.  Patient reports she receives assistance three days a week for two hours a day with the agency [employer]. Patient reports her aide's name is Lowella Bandy.  Patient reports she is able to perform her ADL's on her own without difficulty, "Rosann Auerbach does my cleaning and everything like that."  Patient does not wear oxygen at home. Patient has a walker, shower chair, lift chair and wheelchair at home. Patient has received home health services through [facility] in the past.  Patient's son at bedside, Jeff Mccallum [phone].  No further EDCM needs at this time.

## 2014-02-12 NOTE — Progress Notes (Signed)
ANTICOAGULATION CONSULT NOTE - Initial Consult  Pharmacy Consult for Lovenox Indication: Rule out PE  Allergies  Allergen Reactions  . Sulfa Antibiotics Nausea Only    Patient Measurements: Height: 5\' 6"  (167.6 cm) Weight: 100 lb (45.36 kg) IBW/kg (Calculated) : 59.3  Vital Signs: Temp: 98.2 F (36.8 C) (02/01 1700) Temp Source: Oral (02/01 1700) BP: 160/65 mmHg (02/01 1700) Pulse Rate: 68 (02/01 1700)  Labs:  Recent Labs  02/12/14 1234  HGB 9.3*  HCT 30.1*  PLT 221  CREATININE 0.83    Estimated Creatinine Clearance: 26.5 mL/min (by C-G formula based on Cr of 0.83).   Medical History: Past Medical History  Diagnosis Date  . Hypertension   . Hyperlipidemia   . Arthritis   . Macular degeneration   . Aortic insufficiency   . Vitamin D deficiency   . Coronary artery disease   . LBBB (left bundle branch block)   . Ascending aortic aneurysm   . Hip fracture   . Paroxysmal atrial fibrillation   . Tachycardia-bradycardia     s/pPPM    Medications:  Scheduled:  . [START ON 02/13/2014] aspirin EC  81 mg Oral Daily  . enoxaparin (LOVENOX) injection  45 mg Subcutaneous Once  . [START ON 02/13/2014] furosemide  40 mg Intravenous Once  . [START ON 02/13/2014] isosorbide mononitrate  30 mg Oral Daily  . levalbuterol  1.25 mg Nebulization 4 times per day  . metoprolol tartrate  12.5 mg Oral BID  . potassium chloride  40 mEq Oral Once  . [START ON 02/13/2014] pravastatin  10 mg Oral q1800   Infusions:   PRN: ALPRAZolam, HYDROcodone-acetaminophen  Assessment: 81 yo female admitted with shortness of breath likely d/t acute on chronic diastolic heart failure.  Given elevated d-dimer, also concern for PE. Pharmacy is consulted to dose treatment dose 1 tonight, VQ scan in the morning, if VQ scan negative prophylactic dose in the morning.  Goal of Therapy:  Anti-Xa level 0.6-1 units/ml 4hrs after LMWH dose given Monitor platelets by anticoagulation protocol: Yes   Plan:    Lovenox 1 mg/kg (45mg ) SQ x 1 dose tonight  F/u VQ scan results tomorrow to determine further dosing - therapeutic vs prophylactic  F/u renal function, CBC, signs/symptoms of bleeding  Peggyann Juba, PharmD, BCPS Pager: [phone] 02/12/2014,6:21 PM

## 2014-02-12 NOTE — Patient Instructions (Signed)
Urine culture is pending. Continue same medications and return in 6 months.

## 2014-02-12 NOTE — Telephone Encounter (Signed)
Called Patient's son Sephora Boyar. Son is concerned about his mother's condition. Patient has been SOB for three days. Called patient's house and talked with patient. Counted respirations at 22 a minutes. Patient's CNA, Remonica was at the patient's house at the time. Spoke with CNA about patient's SOB. CNA reported patient is very weak and SOB, with a BP of 110/60. CNA states this is not normal for the patient. Advised Son and CNA that patient needs to go to ED. They agreed to plan and will take patient to ED.

## 2014-02-12 NOTE — ED Notes (Signed)
Pt with Hx of stable ascending aortic aneurysm reports intermittent SOB and weakness x 7 days, worsened last 2 days. O2 saturation 88% on room air. Denies abdominal pain, denies chest pain.

## 2014-02-12 NOTE — H&P (Addendum)
[facility] History and Physical  Felicia [name] YWV:[id number] DOB: 12/02/1912 DOA: 02/12/2014  Referring physician: * PCP: [name], MD   Chief Complaint: sob  HPI:  81 year old woman , history of paroxysmal atrial fibrillation(CHADS2VASC score of at least 4. She and her daughter decline anticoagulation ) ,tachybradycardia syndrome with functioning pacemaker implanted 06/23/10 at [place] diastolic heart failure with increased shortness of breath and peripheral edema, Last echocardiogram 08/18/09 showed normal systolic function with grade 1 diastolic dysfunction left bundle-branch block , ischemic heart disease status post angioplasty of LAD in 1994  , hx of ascending aortic aneurysm and a known murmur of aortic insufficiency. Because of her age and comorbidities, these problems are being managed medically by her cradiologist Dr Mare Ferrari.  Started on lasix 8/15,comes in with SOB for 3 days  , progressed from DOE to DOE at rest .She denies any fevers or chills. She denies any productive cough. She denies experiencing any chest discomfort. She also feels somewhat weak in his if she has less energy than normal. She does report swelling in both legs, however this is baseline for her.When ambulated, her saturations dropped to 78%.Denies recent hx of travel.     Review of Systems: negative for the following  Constitutional: Denies fever, chills, diaphoresis, appetite change and fatigue.  HEENT: Denies photophobia, eye pain, redness, hearing loss, ear pain, congestion, sore throat, rhinorrhea, sneezing, mouth sores, trouble swallowing, neck pain, neck stiffness and tinnitus.  Respiratory: Positive for shortness of breath., DOE, cough, chest tightness, and wheezing.  Cardiovascular: Denies chest pain, palpitations and leg swelling.  Gastrointestinal: Denies nausea, vomiting, abdominal pain, diarrhea, constipation, blood in stool and abdominal distention.  Genitourinary: Denies  dysuria, urgency, frequency, hematuria, flank pain and difficulty urinating.  Musculoskeletal: Denies myalgias, back pain, complaining of leg swelling, arthralgias and gait problem.  Skin: Denies pallor, rash and wound.  Neurological: Denies dizziness, seizures, syncope, weakness, light-headedness, numbness and headaches.  Hematological: Denies adenopathy. Easy bruising, personal or family bleeding history  Psychiatric/Behavioral: Denies suicidal ideation, mood changes, confusion, nervousness, sleep disturbance and agitation       Past Medical History  Diagnosis Date  . Hypertension   . Hyperlipidemia   . Arthritis   . Macular degeneration   . Aortic insufficiency   . Vitamin D deficiency   . Coronary artery disease   . LBBB (left bundle branch block)   . Ascending aortic aneurysm   . Hip fracture   . Paroxysmal atrial fibrillation   . Tachycardia-bradycardia     s/pPPM     Past Surgical History  Procedure Laterality Date  . Appendectomy    . Abdominal hysterectomy    . Fracture surgery      left hip  . Eye surgery      cataracts  . Coronary artery bypass graft  11/11/1992    EF 70%  . Transthoracic echocardiogram  08/18/2009    EF 60-65%  . US echocardiography  07/22/2009    EF 55-60%  . Transthoracic echocardiogram  05/02/2009    EF 45-50%  . US echocardiography  07/18/2008    EF 55-60%  . US echocardiography  07/14/2007    EF  55-60%  . US echocardiography  06/09/2006    EF 55-60%  . Cardiovascular stress test  08/21/2002    EF 65%, NO ISCHEMIA  . Pacemaker insertion  06/2010    [place] 60 VVI pacemaker implanted by Dr Willis Modena at [facility] for tachy/brady syndrome      Social  History:    History  Substance Use Topics  . Smoking status: Never Smoker   . Smokeless tobacco: Never Used  . Alcohol Use: No     Allergies  Allergen Reactions  . Sulfa Antibiotics Nausea Only    Family History  Problem Relation Age of Onset  . Heart attack     . Heart attack Father   . Diabetes Son         Prior to Admission medications   Medication Sig Start Date End Date Taking? Authorizing Provider  ALPRAZolam Duanne Moron) 0.5 MG tablet TAKE 1/2 TO 1 TABLET BY MOUTH TWICE DAILY 10/03/13  Yes [name], MD  aspirin EC 81 MG tablet Take 81 mg by mouth daily.   Yes Historical Provider, MD  B Complex Vitamins (VITAMIN B-COMPLEX PO) Take 1 tablet by mouth daily.   Yes Historical Provider, MD  Cholecalciferol (VITAMIN D-3 PO) Take 1 tablet by mouth daily.   Yes Historical Provider, MD  Ciprofloxacin-Dexamethasone (CIPRODEX OT) Place 1 drop into both eyes daily as needed (dry eyes).   Yes Historical Provider, MD  ferrous sulfate 325 (65 FE) MG tablet Take 325 mg by mouth daily with breakfast.     Yes Historical Provider, MD  furosemide (LASIX) 20 MG tablet 1/2 TABLET DAILY 12/15/13  Yes Darlin Coco, MD  HYDROcodone-acetaminophen (NORCO/VICODIN) 5-325 MG per tablet TAKE ONE TABLET BY MOUTH EVERY TWELVE HOURS AS NEEDED FOR PAIN 12/22/13  Yes [name], MD  isosorbide mononitrate (IMDUR) 60 MG 24 hr tablet TAKE 1/2 TABLET ONCE DAILY 06/22/13  Yes [name], MD  lisinopril (PRINIVIL,ZESTRIL) 20 MG tablet TAKE ONE TABLET BY MOUTH ONCE DAILY 03/31/13  Yes [name], MD  lovastatin (MEVACOR) 20 MG tablet TAKE ONE TABLET BY MOUTH ONCE DAILY 06/26/13  Yes [name], MD  metoprolol tartrate (LOPRESSOR) 25 MG tablet TAKE 1/2 TABLET BY MOUTH TWICE DAILY 07/07/13  Yes [name], MD  Multiple Vitamin (MULTIVITAMIN) capsule Take 1 capsule by mouth daily.     Yes Historical Provider, MD  Multiple Vitamins-Minerals (OCUVITE ADULT 50+ PO) Take 1 capsule by mouth 2 (two) times daily at 10 AM and 5 PM.    Yes Historical Provider, MD  Vitamin D, Ergocalciferol, (DRISDOL) 50000 UNITS CAPS TAKE ONE CAPSULE BY MOUTH ONCE A WEEK FOR 12 WEEKS. 07/22/11  Yes [name], MD  amoxicillin (AMOXIL) 500 MG capsule Take 1 capsule (500 mg total) by mouth 3 (three)  times daily. Patient not taking: Reported on 02/12/2014 01/01/14   [name], MD  ciprofloxacin (CIPRO) 250 MG tablet Take 250 mg by mouth daily as needed (UTI).  06/29/13   [name], MD     Physical Exam: Filed Vitals:   02/12/14 1132 02/12/14 1429 02/12/14 1514  BP: 144/55 135/56   Pulse: 62 55   Temp: 98.3 F (36.8 C) 98.6 F (37 C)   TempSrc: Oral Oral   Resp: 18 14   SpO2: 99% 99% 78%     Constitutional: Vital signs reviewed. Patient is a well-developed and well-nourished in no acute distress and cooperative with exam. Alert and oriented x3.  Head: Normocephalic and atraumatic  Ear: TM normal bilaterally  Mouth: no erythema or exudates, MMM  Eyes: PERRL, EOMI, conjunctivae normal, No scleral icterus.  Neck: Supple, Trachea midline normal ROM, No JVD, mass, thyromegaly, or carotid bruit present.  Cardiovascular: RRR, S1 normal, S2 normal, no MRG, pulses symmetric and intact bilaterally  Pulmonary/Chest:  By basilar inspiratory crackles, or rhonchi  Abdominal: Soft. Non-tender, non-distended, bowel sounds are normal, no masses, organomegaly, or guarding present.  GU: no CVA tenderness Musculoskeletal: Normal range of motion. She exhibits edema.  There is 2+ pitting edema of the bilateral lower extremities.  Hematology: no cervical, inginal, or axillary adenopathy.  Neurological: A&O x3, Strenght is normal and symmetric bilaterally, cranial nerve II-XII are grossly intact, no focal motor deficit, sensory intact to light touch bilaterally.  Skin: Warm, dry and intact. No rash, cyanosis, or clubbing.  Psychiatric: Normal mood and affect. speech and behavior is normal. Judgment and thought content normal. Cognition and memory are normal.       Labs on Admission:    Basic Metabolic Panel:  Recent Labs Lab 02/12/14 1234  NA 138  K 4.4  CL 102  CO2 29  GLUCOSE 95  BUN 42*  CREATININE 0.83  CALCIUM 8.7   Liver Function Tests:  Recent Labs Lab 02/12/14 1234   AST 20  ALT 12  ALKPHOS 67  BILITOT 0.7  PROT 6.4  ALBUMIN 3.3*   No results for input(s): LIPASE, AMYLASE in the last 168 hours. No results for input(s): AMMONIA in the last 168 hours. CBC:  Recent Labs Lab 02/12/14 1234  WBC 5.3  NEUTROABS 4.1  HGB 9.3*  HCT 30.1*  MCV 90.7  PLT 221   Cardiac Enzymes: No results for input(s): CKTOTAL, CKMB, CKMBINDEX, TROPONINI in the last 168 hours.  BNP (last 3 results)  Recent Labs  02/12/14 1234  BNP 838.4*    ProBNP (last 3 results) No results for input(s): PROBNP in the last 8760 hours.     CBG: No results for input(s): GLUCAP in the last 168 hours.  Radiological Exams on Admission: Dg Chest 2 View  02/12/2014   CLINICAL DATA:  Ascending aortic aneurysm with intermittent shortness of breath and weakness for 1 week. History of hypertension. Initial encounter.  EXAM: CHEST  2 VIEW  COMPARISON:  09/15/2012 radiographs.  Chest CT 05/02/2009.  FINDINGS: The left subclavian pacemaker lead appears unchanged. There is cardiomegaly and diffuse aortic atherosclerosis. Uncorrected for magnification, the ascending aorta measures 8.8 cm on the lateral view, increased from the prior radiographs at which time it measure approximately 7.5 cm. There is chronic lung disease with pleural parenchymal scarring and superimposed bibasilar atelectasis. There are new small bilateral pleural effusions. Multiple thoracic compression deformities are grossly stable status post 2 level augmentation  IMPRESSION: 1. New bilateral pleural effusions with associated bibasilar atelectasis. 2. Suspected progressive enlargement of the ascending aortic aneurysm compared with prior radiographs and CT. Follow up Chest CTA recommended.   Electronically Signed   By: Camie Patience M.D.   On: 02/12/2014 14:08    EKG: Independently reviewed. Labs Reviewed - No data to display  Imaging Review  Imaging Results (Last 48 hours)    No results found.    Date:  02/12/2014 Rate: 62 Rhythm: normal sinus rhythm QRS Axis: normal Intervals: normal ST/T Wave abnormalities: normal Conduction Disutrbances:left bundle branch block Narrative Interpretation:  Old EKG Reviewed: none available   Assessment/Plan Active Problems: Shortness of breath likely secondary to acute on chronic diastolic heart failure, last known EF of 60-65% in 2011 D-dimer elevated On a VQ scan to rule out PE-this can be done as a routine study in the morning, will give 1 therapeutic dose of Lovenox tonight Diuresed with IV Lasix, symptoms already improved with one dose Repeat at 6 AM tomorrow History of atrial fibrillation  but currently normal sinus rhythm 2-D echo, venous Doppler    HTN (hypertension)-continue Imdur, Lopressor, Lasix    Coronary disease-cycle cardiac enzymes    Aortic insufficiency-obtain 2-D echo in the morning    Anemia-hemoglobin at baseline of 9.8-10.4    Cardiac pacemaker-consult cardiology in the morning for interrogation, last checked 10/15 by [name]    PAF (paroxysmal atrial fibrillation)-rate controlled, continue beta blocker, not on anticoagulation as mentioned in history of present illness  1.     Code Status:   full Family Communication: bedside Disposition Plan: admit   Time spent: 70 mins   [facility] Pager [phone]  If 7PM-7AM, please contact night-coverage [email or web address] Password [email or web address] 02/12/2014, 3:59 PM

## 2014-02-12 NOTE — ED Notes (Signed)
Spoke to charge nurse Jana Half...klj

## 2014-02-13 ENCOUNTER — Inpatient Hospital Stay (HOSPITAL_COMMUNITY): Payer: Medicare Other

## 2014-02-13 DIAGNOSIS — I480 Paroxysmal atrial fibrillation: Secondary | ICD-10-CM

## 2014-02-13 DIAGNOSIS — I351 Nonrheumatic aortic (valve) insufficiency: Secondary | ICD-10-CM

## 2014-02-13 DIAGNOSIS — Z950 Presence of cardiac pacemaker: Secondary | ICD-10-CM

## 2014-02-13 DIAGNOSIS — I10 Essential (primary) hypertension: Secondary | ICD-10-CM

## 2014-02-13 DIAGNOSIS — M7989 Other specified soft tissue disorders: Secondary | ICD-10-CM

## 2014-02-13 DIAGNOSIS — I5031 Acute diastolic (congestive) heart failure: Secondary | ICD-10-CM

## 2014-02-13 LAB — BASIC METABOLIC PANEL
Anion gap: 8 (ref 5–15)
BUN: 39 mg/dL — ABNORMAL HIGH (ref 6–23)
CHLORIDE: 99 mmol/L (ref 96–112)
CO2: 34 mmol/L — ABNORMAL HIGH (ref 19–32)
Calcium: 9 mg/dL (ref 8.4–10.5)
Creatinine, Ser: 0.99 mg/dL (ref 0.50–1.10)
GFR calc non Af Amer: 46 mL/min — ABNORMAL LOW (ref >90)
GFR, EST AFRICAN AMERICAN: 53 mL/min — AB (ref >90)
GLUCOSE: 104 mg/dL — AB (ref 70–99)
Potassium: 4.5 mmol/L (ref 3.5–5.1)
SODIUM: 141 mmol/L (ref 135–145)

## 2014-02-13 LAB — TROPONIN I

## 2014-02-13 MED ORDER — ENOXAPARIN SODIUM 30 MG/0.3ML ~~LOC~~ SOLN
30.0000 mg | SUBCUTANEOUS | Status: DC
  Administered 2014-02-13 – 2014-02-14 (×2): 30 mg via SUBCUTANEOUS
  Filled 2014-02-13 (×3): qty 0.3

## 2014-02-13 MED ORDER — GUAIFENESIN ER 600 MG PO TB12
600.0000 mg | ORAL_TABLET | Freq: Two times a day (BID) | ORAL | Status: DC
  Administered 2014-02-13 – 2014-02-15 (×5): 600 mg via ORAL
  Filled 2014-02-13 (×6): qty 1

## 2014-02-13 MED ORDER — TRAZODONE HCL 50 MG PO TABS
25.0000 mg | ORAL_TABLET | Freq: Every evening | ORAL | Status: DC | PRN
  Administered 2014-02-14: 25 mg via ORAL
  Filled 2014-02-13: qty 1

## 2014-02-13 MED ORDER — FUROSEMIDE 40 MG PO TABS
40.0000 mg | ORAL_TABLET | Freq: Every day | ORAL | Status: DC
  Administered 2014-02-14 – 2014-02-15 (×2): 40 mg via ORAL
  Filled 2014-02-13 (×2): qty 1

## 2014-02-13 MED ORDER — TEMAZEPAM 15 MG PO CAPS: 30.0000 mg | ORAL_CAPSULE | Freq: Every evening | ORAL | Status: DC | PRN

## 2014-02-13 MED ORDER — TECHNETIUM TO 99M ALBUMIN AGGREGATED
5.5000 | Freq: Once | INTRAVENOUS | Status: AC | PRN
  Administered 2014-02-13: 6 via INTRAVENOUS

## 2014-02-13 MED ORDER — TECHNETIUM TC 99M DIETHYLENETRIAME-PENTAACETIC ACID: 49.5000 | Freq: Once | INTRAVENOUS | Status: AC | PRN

## 2014-02-13 NOTE — Progress Notes (Addendum)
ANTICOAGULATION CONSULT NOTE - Initial Consult  Pharmacy Consult for Lovenox Indication: VTE prophylaxis  Allergies  Allergen Reactions  . Sulfa Antibiotics Nausea Only    Patient Measurements: Height: 5\' 6"  (167.6 cm) Weight: 98 lb 3.2 oz (44.543 kg) IBW/kg (Calculated) : 59.3  Vital Signs: Temp: 98.7 F (37.1 C) (02/02 0518) Temp Source: Oral (02/02 0518) BP: 150/68 mmHg (02/02 0518) Pulse Rate: 61 (02/02 0518)  Labs:  Recent Labs  02/12/14 1234 02/12/14 1759 02/12/14 2339 02/13/14 0540  HGB 9.3*  --   --   --   HCT 30.1*  --   --   --   PLT 221  --   --   --   CREATININE 0.83  --   --  0.99  TROPONINI  --  <0.03 <0.03 <0.03    Estimated Creatinine Clearance: 21.8 mL/min (by C-G formula based on Cr of 0.99).   Medical History: Past Medical History  Diagnosis Date  . Hypertension   . Hyperlipidemia   . Arthritis   . Macular degeneration   . Aortic insufficiency   . Vitamin D deficiency   . Coronary artery disease   . LBBB (left bundle branch block)   . Ascending aortic aneurysm   . Hip fracture   . Paroxysmal atrial fibrillation   . Tachycardia-bradycardia     s/pPPM    Medications:  Scheduled:  . aspirin EC  81 mg Oral Daily  . enoxaparin (LOVENOX) injection  30 mg Subcutaneous Q24H  . [START ON 02/14/2014] furosemide  40 mg Oral Daily  . guaiFENesin  600 mg Oral BID  . isosorbide mononitrate  30 mg Oral Daily  . metoprolol tartrate  12.5 mg Oral BID  . pravastatin  10 mg Oral q1800   PRN: ALPRAZolam, HYDROcodone-acetaminophen, levalbuterol, technetium TC 11M diethylenetriame-pentaacetic acid  Assessment: 81 y/o female with PMH of paroxysmal atrial fibrillation not on anticoagulation (patient/family decline) admitted with shortness of breath likely d/t acute on chronic diastolic heart failure.  Given elevated d-dimer, also concern for PE. Patient was given one dose of therapeutic dose lovenox yesterday PM.  VQ scan done this morning shows low  probability for PE.  Preliminary result of venous dopplers of BLE negative for DVT.  Pharmacy consulted to change lovenox to prophylactic dosing.     Lovenox 45 mg SQ x 1 given 2/1 at 2239  Weight 44.5 kg  SCr 0.99, CrCl ~ 22 mL/min  Hgb on 2/1 = 9.3, at baseline; Pltc WNL  No bleeding issues reported  Goal of Therapy:  Absence of VTE Monitor platelets by anticoagulation protocol: Yes   Plan:   Start Lovenox 30 mg SQ q24h at 2200 tonight.  F/u renal function, CBC, signs/symptoms of bleeding.   Lindell Spar, PharmD, BCPS Pager: [phone] 02/13/2014 11:24 AM

## 2014-02-13 NOTE — Evaluation (Signed)
Occupational Therapy Evaluation Patient Details Name: Felicia [name] MRN: [id number] DOB: 03-13-1912 Today's Date: 02/13/2014    History of Present Illness 81 year old female was admitted for CHF exacerbation.  She has a h/o A-Fib, HTN, pacemaker   Clinical Impression   Pt was admitted for the above.  She has 2 hours of assistance at home, and otherwise is mod I for ADLs, making a sandwich, etc.  She will benefit from skilled OT.  Goals in acute are for supervision to min guard level.      Follow Up Recommendations  Home health OT;SNF (depending upon progress)    Equipment Recommendations  3 in 1 bedside comode    Recommendations for Other Services       Precautions / Restrictions Precautions Precautions: Fall Restrictions Weight Bearing Restrictions: No      Mobility Bed Mobility Overal bed mobility: Needs Assistance Bed Mobility: Supine to Sit;Sit to Supine     Supine to sit: Supervision;HOB elevated Sit to supine: Supervision;HOB elevated   General bed mobility comments: extra time; supervision for safey  Transfers Overall transfer level: Needs assistance Equipment used: Rolling walker (2 wheeled) Transfers: Sit to/from [facility] Sit to Stand: Min assist Stand pivot transfers: Min assist       General transfer comment: assist to rise and steady    Balance                                            ADL Overall ADL's : Needs assistance/impaired     Grooming: Set up;Sitting   Upper Body Bathing: Set up;Sitting   Lower Body Bathing: Moderate assistance;Sit to/from stand   Upper Body Dressing : Minimal assistance;Sitting (lines)   Lower Body Dressing: Maximal assistance;Sit to/from stand   Toilet Transfer: Minimal assistance;Stand-pivot;BSC   Toileting- Clothing Manipulation and Hygiene: Set up;Sitting/lateral lean         General ADL Comments: pt states that she can usually put socks on but it is difficult.   Height of 3:1/bed was too high for her.       Vision                     Perception     Praxis      Pertinent Vitals/Pain Pain Assessment: No/denies pain     Hand Dominance     Extremity/Trunk Assessment Upper Extremity Assessment Upper Extremity Assessment: Generalized weakness       Cervical / Trunk Assessment Cervical / Trunk Assessment: Kyphotic   Communication Communication Communication: No difficulties   Cognition Arousal/Alertness: Awake/alert Behavior During Therapy: WFL for tasks assessed/performed Overall Cognitive Status: Within Functional Limits for tasks assessed                     General Comments       Exercises       Shoulder Instructions      Home Living Family/patient expects to be discharged to:: Private residence Living Arrangements: Alone Available Help at Discharge: Personal care attendant (2 hours a day; son/grandson check on her)                                    Prior Functioning/Environment Level of Independence: Needs assistance (has help 2 hours a day)  Comments: uses walker all the time    OT Diagnosis: Generalized weakness   OT Problem List: Decreased strength;Decreased activity tolerance;Impaired balance (sitting and/or standing);Decreased knowledge of use of DME or AE;Cardiopulmonary status limiting activity   OT Treatment/Interventions: Self-care/ADL training;DME and/or AE instruction;Patient/family education;Balance training    OT Goals(Current goals can be found in the care plan section) Acute Rehab OT Goals Patient Stated Goal: get strength back OT Goal Formulation: With patient Time For Goal Achievement: 02/27/14 Potential to Achieve Goals: Good ADL Goals Pt Will Perform Grooming: with supervision;standing Pt Will Perform Lower Body Bathing: sit to/from stand;with adaptive equipment;with supervision Pt Will Perform Lower Body Dressing: with adaptive equipment;sit to/from  stand;with supervision Pt Will Transfer to Toilet: ambulating;bedside commode;with min guard assist (pt with macular degeneration)  OT Frequency: Min 2X/week   Barriers to D/C:            Co-evaluation              End of Session Equipment Utilized During Treatment: Rolling walker  Activity Tolerance: Patient limited by fatigue (pt states she did not sleep well) Patient left: in bed;with call bell/phone within reach;with bed alarm set   Time: 6244-6950 OT Time Calculation (min): 15 min Charges:  OT General Charges $OT Visit: 1 Procedure OT Evaluation $Initial OT Evaluation Tier I: 1 Procedure G-Codes:    Alta Goding 02-19-2014, 2:04 PM  Lesle Chris, OTR/L [phone] 02/19/2014

## 2014-02-13 NOTE — Progress Notes (Signed)
[facility] PROGRESS NOTE  Felicia [name] MPN:[id number] DOB: 16-Jul-1912 DOA: 02/12/2014 PCP: Felicia Showers, MD  Assessment/Plan: Active Problems:   HTN (hypertension)   Coronary disease   Aortic insufficiency   Anemia   Cardiac pacemaker   PAF (paroxysmal atrial fibrillation)   Dyspnea on exertion   CHF exacerbation    Shortness of breath likely secondary to acute on chronic diastolic heart failure, last known EF of 60-65% in 2011 D-dimer elevated VQ scan reviewed, low probability for PE, venous Doppler pending Symptoms improved with IV Lasix, start the patient on Lasix 40 mg a day Follow BMP, 2-D echo pending History of atrial fibrillation but currently normal sinus rhythm Cardiology consult called    HTN (hypertension)-continue Imdur, Lopressor, Lasix   Coronary disease-cardiac enzymes reviewed, they are negative, continue Lopressor   Aortic insufficiency-obtain 2-D echo in the morning   Anemia-hemoglobin at baseline of 9.8-10.4   Cardiac pacemaker-call cardiology for interrogation, last checked 10/15 by [name]   PAF (paroxysmal atrial fibrillation)-rate controlled, continue beta blocker, not on anticoagulation as mentioned in history of present illness  Code Status: full Family Communication: family updated about patient's clinical progress Disposition Plan: PT/OT evaluation   Brief narrative: HPI:  81 year old woman , history of paroxysmal atrial fibrillation(CHADS2VASC score of at least 4. She and her daughter decline anticoagulation ) ,tachybradycardia syndrome with functioning pacemaker implanted 06/23/10 at [facility] diastolic heart failure with increased shortness of breath and peripheral edema, Last echocardiogram 08/18/09 showed normal systolic function with grade 1 diastolic dysfunction left bundle-branch block , ischemic heart disease status post angioplasty of LAD in 1994  , hx of ascending aortic aneurysm and a known murmur of  aortic insufficiency. Because of her age and comorbidities, these problems are being managed medically by her cradiologist Dr Mare Ferrari.  Started on lasix 8/15,comes in with SOB for 3 days , progressed from DOE to DOE at rest .She denies any fevers or chills. She denies any productive cough. She denies experiencing any chest discomfort. She also feels somewhat weak in his if she has less energy than normal. She does report swelling in both legs, however this is baseline for her.When ambulated, her saturations dropped to 78%.Denies recent hx of travel.  Consultants:  None  Procedures:  None  Antibiotics: None  HPI/Subjective: Sitting comfortably in bed, shortness of breath improved compared to yesterday, no chest pain  Objective: Filed Vitals:   02/12/14 2049 02/12/14 2112 02/13/14 0500 02/13/14 0518  BP: 160/50   150/68  Pulse: 61   61  Temp: 98.1 F (36.7 C)   98.7 F (37.1 C)  TempSrc: Oral   Oral  Resp: 17   18  Height:      Weight:   44.543 kg (98 lb 3.2 oz)   SpO2: 100% 93%  100%    Intake/Output Summary (Last 24 hours) at 02/13/14 1105 Last data filed at 02/13/14 1055  Gross per 24 hour  Intake    120 ml  Output   3300 ml  Net  -3180 ml    Exam:  General: No acute respiratory distress Lungs: Clear to auscultation bilaterally without wheezes or crackles Cardiovascular: Regular rate and rhythm without murmur gallop or rub normal S1 and S2 Abdomen: Nontender, nondistended, soft, bowel sounds positive, no rebound, no ascites, no appreciable mass Extremities: No significant cyanosis, clubbing, or edema bilateral lower extremities      Data Reviewed: Basic Metabolic Panel:  Recent Labs Lab 02/12/14 1234 02/13/14 0540  NA 138 141  K 4.4 4.5  CL 102 99  CO2 29 34*  GLUCOSE 95 104*  BUN 42* 39*  CREATININE 0.83 0.99  CALCIUM 8.7 9.0    Liver Function Tests:  Recent Labs Lab 02/12/14 1234  AST 20  ALT 12  ALKPHOS 67  BILITOT 0.7  PROT 6.4   ALBUMIN 3.3*   No results for input(s): LIPASE, AMYLASE in the last 168 hours. No results for input(s): AMMONIA in the last 168 hours.  CBC:  Recent Labs Lab 02/12/14 1234  WBC 5.3  NEUTROABS 4.1  HGB 9.3*  HCT 30.1*  MCV 90.7  PLT 221    Cardiac Enzymes:  Recent Labs Lab 02/12/14 1759 02/12/14 2339 02/13/14 0540  TROPONINI <0.03 <0.03 <0.03   BNP (last 3 results)  Recent Labs  02/12/14 1234  BNP 838.4*    ProBNP (last 3 results) No results for input(s): PROBNP in the last 8760 hours.    CBG: No results for input(s): GLUCAP in the last 168 hours.  No results found for this or any previous visit (from the past 240 hour(s)).   Studies: Dg Chest 2 View  02/12/2014   CLINICAL DATA:  Ascending aortic aneurysm with intermittent shortness of breath and weakness for 1 week. History of hypertension. Initial encounter.  EXAM: CHEST  2 VIEW  COMPARISON:  09/15/2012 radiographs.  Chest CT 05/02/2009.  FINDINGS: The left subclavian pacemaker lead appears unchanged. There is cardiomegaly and diffuse aortic atherosclerosis. Uncorrected for magnification, the ascending aorta measures 8.8 cm on the lateral view, increased from the prior radiographs at which time it measure approximately 7.5 cm. There is chronic lung disease with pleural parenchymal scarring and superimposed bibasilar atelectasis. There are new small bilateral pleural effusions. Multiple thoracic compression deformities are grossly stable status post 2 level augmentation  IMPRESSION: 1. New bilateral pleural effusions with associated bibasilar atelectasis. 2. Suspected progressive enlargement of the ascending aortic aneurysm compared with prior radiographs and CT. Follow up Chest CTA recommended.   Electronically Signed   By: Camie Patience M.D.   On: 02/12/2014 14:08   Nm Pulmonary Perf And Vent  02/13/2014   CLINICAL DATA:  Shortness of breath  EXAM: NUCLEAR MEDICINE VENTILATION - PERFUSION LUNG SCAN  TECHNIQUE:  Ventilation images were obtained in multiple projections using inhaled aerosol technetium 99 M DTPA. Perfusion images were obtained in multiple projections after intravenous injection of Tc-77m MAA.  RADIOPHARMACEUTICALS:  49.5 mCi Tc-50m DTPA aerosol and 5.5 mCi Tc-58m MAA  COMPARISON:  Chest x-ray from 1 day prior  FINDINGS: There is no definite segmental perfusion defect when accounting for pleural effusions and artifact from pacer battery pack in the left chest. There are patchy counts on oblique imaging, but no clear peripheral defect unexplained by artifact or mediastinal widening (there is cardiomegaly and a large ascending aortic aneurysm on comparison radiography). Aerosolized tracer deposition is heterogeneous consistent with airways disease.  IMPRESSION: Low probability for pulmonary embolism.   Electronically Signed   By: Jorje Guild M.D.   On: 02/13/2014 09:10    Scheduled Meds: . aspirin EC  81 mg Oral Daily  . guaiFENesin  600 mg Oral BID  . isosorbide mononitrate  30 mg Oral Daily  . metoprolol tartrate  12.5 mg Oral BID  . pravastatin  10 mg Oral q1800   Continuous Infusions:   Active Problems:   HTN (hypertension)   Coronary disease   Aortic insufficiency   Anemia   Cardiac pacemaker   PAF (paroxysmal atrial fibrillation)  Dyspnea on exertion   CHF exacerbation    Time spent: 40 minutes   [facility] Pager [phone]. If 7PM-7AM, please contact night-coverage at [email or web address], password [facility] 02/13/2014, 11:05 AM  LOS: 1 day

## 2014-02-13 NOTE — Consult Note (Addendum)
CONSULTATION NOTE  Reason for Consult: CHF   Requesting Physician: Dr. Allyson Sabal   Cardiologist: [name]  HPI: This is a 81 y.o. female with a past medical history significant for PAF which apparently is remote. She has a pacemaker which was implanted for bradycardia at [facility] in 2012. There is also a history of CAD. She had angioplasty in 1994. She has an ascending aortic aneurysm which is being medically managed due to her age and associated aortic insufficiency. Recently she has been having increasing dyspnea and was started on low dose lasix 10 mg daily. She lost 6 lbs with this and her dyspnea improved, however, over the past 3 days she's had progressive shortness of breath with dyspnea and exertion and ultimately rest dyspnea. O2 saturations dropped to 78% with exertion. Last echocardiogram in the system dates back to August 2011 which showed grade 1 diastolic dysfunction and left bundle branch block with an EF of 60-65%.  Troponin is negative 3. BNP is elevated at 838. D-dimer was elevated, however this may be consistent with age as subsequent VQ scan was low probability for PE. Chest x-ray shows new bilateral pleural effusions and probable enlargement of an ascending aortic aneurysm. EKG shows normal sinus rhythm with left bundle branch block.  PMHx:  Past Medical History  Diagnosis Date  . Hypertension   . Hyperlipidemia   . Arthritis   . Macular degeneration   . Aortic insufficiency   . Vitamin D deficiency   . Coronary artery disease   . LBBB (left bundle branch block)   . Ascending aortic aneurysm   . Hip fracture   . Paroxysmal atrial fibrillation   . Tachycardia-bradycardia     s/pPPM   Past Surgical History  Procedure Laterality Date  . Appendectomy    . Abdominal hysterectomy    . Fracture surgery      left hip  . Eye surgery      cataracts  . Coronary artery bypass graft  11/11/1992    EF 70%  . Transthoracic echocardiogram  08/18/2009    EF  60-65%  . US echocardiography  07/22/2009    EF 55-60%  . Transthoracic echocardiogram  05/02/2009    EF 45-50%  . US echocardiography  07/18/2008    EF 55-60%  . US echocardiography  07/14/2007    EF  55-60%  . US echocardiography  06/09/2006    EF 55-60%  . Cardiovascular stress test  08/21/2002    EF 65%, NO ISCHEMIA  . Pacemaker insertion  06/2010    [place] 60 VVI pacemaker implanted by Dr Willis Modena at [facility] for tachy/brady syndrome    FAMHx: Family History  Problem Relation Age of Onset  . Heart attack    . Heart attack Father   . Diabetes Son     SOCHx:  reports that she has never smoked. She has never used smokeless tobacco. She reports that she does not drink alcohol. Her drug history is not on file.  ALLERGIES: Allergies  Allergen Reactions  . Sulfa Antibiotics Nausea Only    ROS: A comprehensive review of systems was negative except for: Respiratory: positive for dyspnea on exertion Cardiovascular: positive for lower extremity edema  HOME MEDICATIONS: Prescriptions prior to admission  Medication Sig Dispense Refill Last Dose  . ALPRAZolam (XANAX) 0.5 MG tablet TAKE 1/2 TO 1 TABLET BY MOUTH TWICE DAILY 60 tablet 5 02/11/2014 at Unknown time  . aspirin EC 81 MG tablet Take 81 mg  by mouth daily.   02/12/2014 at Unknown time  . B Complex Vitamins (VITAMIN B-COMPLEX PO) Take 1 tablet by mouth daily.   02/12/2014 at Unknown time  . Cholecalciferol (VITAMIN D-3 PO) Take 1 tablet by mouth daily.   02/12/2014 at Unknown time  . Ciprofloxacin-Dexamethasone (CIPRODEX OT) Place 1 drop into both eyes daily as needed (dry eyes).   02/11/2014 at Unknown time  . ferrous sulfate 325 (65 FE) MG tablet Take 325 mg by mouth daily with breakfast.     Past Month at Unknown time  . furosemide (LASIX) 20 MG tablet 1/2 TABLET DAILY 45 tablet 1 02/12/2014 at Unknown time  . HYDROcodone-acetaminophen (NORCO/VICODIN) 5-325 MG per tablet TAKE ONE TABLET BY MOUTH EVERY TWELVE HOURS AS NEEDED FOR  PAIN 120 tablet 0 02/12/2014 at Unknown time  . isosorbide mononitrate (IMDUR) 60 MG 24 hr tablet TAKE 1/2 TABLET ONCE DAILY 45 tablet 3 02/12/2014 at Unknown time  . lisinopril (PRINIVIL,ZESTRIL) 20 MG tablet TAKE ONE TABLET BY MOUTH ONCE DAILY 90 tablet 3 02/12/2014 at Unknown time  . lovastatin (MEVACOR) 20 MG tablet TAKE ONE TABLET BY MOUTH ONCE DAILY 30 tablet 11 02/12/2014 at Unknown time  . metoprolol tartrate (LOPRESSOR) 25 MG tablet TAKE 1/2 TABLET BY MOUTH TWICE DAILY 90 tablet 3 02/12/2014 at 0630  . Multiple Vitamin (MULTIVITAMIN) capsule Take 1 capsule by mouth daily.     02/12/2014 at Unknown time  . Multiple Vitamins-Minerals (OCUVITE ADULT 50+ PO) Take 1 capsule by mouth 2 (two) times daily at 10 AM and 5 PM.    02/12/2014 at Unknown time  . Vitamin D, Ergocalciferol, (DRISDOL) 50000 UNITS CAPS TAKE ONE CAPSULE BY MOUTH ONCE A WEEK FOR 12 WEEKS. 8 capsule 0 Past Week at Unknown time  . amoxicillin (AMOXIL) 500 MG capsule Take 1 capsule (500 mg total) by mouth 3 (three) times daily. (Patient not taking: Reported on 02/12/2014) 30 capsule 0 Completed Course at Unknown time  . ciprofloxacin (CIPRO) 250 MG tablet Take 250 mg by mouth daily as needed (UTI).    unknown at unknown time    HOSPITAL MEDICATIONS: Scheduled: . aspirin EC  81 mg Oral Daily  . enoxaparin (LOVENOX) injection  30 mg Subcutaneous Q24H  . [START ON 02/14/2014] furosemide  40 mg Oral Daily  . guaiFENesin  600 mg Oral BID  . isosorbide mononitrate  30 mg Oral Daily  . metoprolol tartrate  12.5 mg Oral BID  . pravastatin  10 mg Oral q1800    VITALS: Blood pressure 150/68, pulse 61, temperature 98.7 F (37.1 C), temperature source Oral, resp. rate 18, height 5\' 6"  (1.676 m), weight 98 lb 3.2 oz (44.543 kg), SpO2 100 %.  PHYSICAL EXAM: General appearance: alert, appears stated age, no distress and sitting up, breathing comfortably Neck: no carotid bruit and no JVD Lungs: diminished breath sounds bibasilar and dullness to  percussion bibasilar Heart: regular rate and rhythm, S1, S2 normal and diastolic murmur: mid diastolic 2/6, crescendo at apex Abdomen: soft, non-tender; bowel sounds normal; no masses,  no organomegaly Extremities: extremities normal, atraumatic, no cyanosis or edema Pulses: 2+ and symmetric Skin: Skin color, texture, turgor normal. No rashes or lesions Neurologic: Grossly normal Psych: Pleasant  LABS: Results for orders placed or performed during the hospital encounter of 02/12/14 (from the past 48 hour(s))  D-dimer, quantitative     Status: Abnormal   Collection Time: 02/12/14 12:30 PM  Result Value Ref Range   D-Dimer, Quant 3.26 (H) 0.00 -  0.48 ug/mL-FEU    Comment:        AT THE INHOUSE ESTABLISHED CUTOFF VALUE OF 0.48 ug/mL FEU, THIS ASSAY HAS BEEN DOCUMENTED IN THE LITERATURE TO HAVE A SENSITIVITY AND NEGATIVE PREDICTIVE VALUE OF AT LEAST 98 TO 99%.  THE TEST RESULT SHOULD BE CORRELATED WITH AN ASSESSMENT OF THE CLINICAL PROBABILITY OF DVT / VTE.   Comprehensive metabolic panel     Status: Abnormal   Collection Time: 02/12/14 12:34 PM  Result Value Ref Range   Sodium 138 135 - 145 mmol/L   Potassium 4.4 3.5 - 5.1 mmol/L   Chloride 102 96 - 112 mmol/L   CO2 29 19 - 32 mmol/L   Glucose, Bld 95 70 - 99 mg/dL   BUN 42 (H) 6 - 23 mg/dL   Creatinine, Ser 0.83 0.50 - 1.10 mg/dL   Calcium 8.7 8.4 - 10.5 mg/dL   Total Protein 6.4 6.0 - 8.3 g/dL   Albumin 3.3 (L) 3.5 - 5.2 g/dL   AST 20 0 - 37 U/L   ALT 12 0 - 35 U/L   Alkaline Phosphatase 67 39 - 117 U/L   Total Bilirubin 0.7 0.3 - 1.2 mg/dL   GFR calc non Af Amer 56 (L) >90 mL/min   GFR calc Af Amer 65 (L) >90 mL/min    Comment: (NOTE) The eGFR has been calculated using the CKD EPI equation. This calculation has not been validated in all clinical situations. eGFR's persistently <90 mL/min signify possible Chronic Kidney Disease.    Anion gap 7 5 - 15  CBC with Differential     Status: Abnormal   Collection Time:  02/12/14 12:34 PM  Result Value Ref Range   WBC 5.3 4.0 - 10.5 K/uL   RBC 3.32 (L) 3.87 - 5.11 MIL/uL   Hemoglobin 9.3 (L) 12.0 - 15.0 g/dL   HCT 30.1 (L) 36.0 - 46.0 %   MCV 90.7 78.0 - 100.0 fL   MCH 28.0 26.0 - 34.0 pg   MCHC 30.9 30.0 - 36.0 g/dL   RDW 14.1 11.5 - 15.5 %   Platelets 221 150 - 400 K/uL   Neutrophils Relative % 78 (H) 43 - 77 %   Neutro Abs 4.1 1.7 - 7.7 K/uL   Lymphocytes Relative 11 (L) 12 - 46 %   Lymphs Abs 0.6 (L) 0.7 - 4.0 K/uL   Monocytes Relative 8 3 - 12 %   Monocytes Absolute 0.4 0.1 - 1.0 K/uL   Eosinophils Relative 3 0 - 5 %   Eosinophils Absolute 0.1 0.0 - 0.7 K/uL   Basophils Relative 0 0 - 1 %   Basophils Absolute 0.0 0.0 - 0.1 K/uL  Brain natriuretic peptide     Status: Abnormal   Collection Time: 02/12/14 12:34 PM  Result Value Ref Range   B Natriuretic Peptide 838.4 (H) 0.0 - 100.0 pg/mL  I-stat troponin, ED     Status: None   Collection Time: 02/12/14 12:43 PM  Result Value Ref Range   Troponin i, poc 0.01 0.00 - 0.08 ng/mL   Comment 3            Comment: Due to the release kinetics of cTnI, a negative result within the first hours of the onset of symptoms does not rule out myocardial infarction with certainty. If myocardial infarction is still suspected, repeat the test at appropriate intervals.   Urinalysis, Routine w reflex microscopic     Status: Abnormal   Collection Time: 02/12/14  2:54 PM  Result Value Ref Range   Color, Urine YELLOW YELLOW   APPearance CLEAR CLEAR   Specific Gravity, Urine 1.012 1.005 - 1.030   pH 5.0 5.0 - 8.0   Glucose, UA NEGATIVE NEGATIVE mg/dL   Hgb urine dipstick NEGATIVE NEGATIVE   Bilirubin Urine NEGATIVE NEGATIVE   Ketones, ur NEGATIVE NEGATIVE mg/dL   Protein, ur NEGATIVE NEGATIVE mg/dL   Urobilinogen, UA 0.2 0.0 - 1.0 mg/dL   Nitrite NEGATIVE NEGATIVE   Leukocytes, UA TRACE (A) NEGATIVE  Urine microscopic-add on     Status: Abnormal   Collection Time: 02/12/14  2:54 PM  Result Value Ref  Range   Squamous Epithelial / LPF RARE RARE   WBC, UA 3-6 <3 WBC/hpf   RBC / HPF 3-6 <3 RBC/hpf   Bacteria, UA RARE RARE   Casts HYALINE CASTS (A) NEGATIVE  Troponin I     Status: None   Collection Time: 02/12/14  5:59 PM  Result Value Ref Range   Troponin I <0.03 <0.031 ng/mL    Comment:        NO INDICATION OF MYOCARDIAL INJURY.   TSH     Status: None   Collection Time: 02/12/14  6:12 PM  Result Value Ref Range   TSH 1.587 0.350 - 4.500 uIU/mL    Comment: Performed at [facility]  Troponin I     Status: None   Collection Time: 02/12/14 11:39 PM  Result Value Ref Range   Troponin I <0.03 <0.031 ng/mL    Comment:        NO INDICATION OF MYOCARDIAL INJURY.   Troponin I     Status: None   Collection Time: 02/13/14  5:40 AM  Result Value Ref Range   Troponin I <0.03 <0.031 ng/mL    Comment:        NO INDICATION OF MYOCARDIAL INJURY.   Basic metabolic panel     Status: Abnormal   Collection Time: 02/13/14  5:40 AM  Result Value Ref Range   Sodium 141 135 - 145 mmol/L   Potassium 4.5 3.5 - 5.1 mmol/L   Chloride 99 96 - 112 mmol/L   CO2 34 (H) 19 - 32 mmol/L   Glucose, Bld 104 (H) 70 - 99 mg/dL   BUN 39 (H) 6 - 23 mg/dL   Creatinine, Ser 0.99 0.50 - 1.10 mg/dL   Calcium 9.0 8.4 - 10.5 mg/dL   GFR calc non Af Amer 46 (L) >90 mL/min   GFR calc Af Amer 53 (L) >90 mL/min    Comment: (NOTE) The eGFR has been calculated using the CKD EPI equation. This calculation has not been validated in all clinical situations. eGFR's persistently <90 mL/min signify possible Chronic Kidney Disease.    Anion gap 8 5 - 15    IMAGING: Dg Chest 2 View  02/12/2014   CLINICAL DATA:  Ascending aortic aneurysm with intermittent shortness of breath and weakness for 1 week. History of hypertension. Initial encounter.  EXAM: CHEST  2 VIEW  COMPARISON:  09/15/2012 radiographs.  Chest CT 05/02/2009.  FINDINGS: The left subclavian pacemaker lead appears unchanged. There is cardiomegaly and  diffuse aortic atherosclerosis. Uncorrected for magnification, the ascending aorta measures 8.8 cm on the lateral view, increased from the prior radiographs at which time it measure approximately 7.5 cm. There is chronic lung disease with pleural parenchymal scarring and superimposed bibasilar atelectasis. There are new small bilateral pleural effusions. Multiple thoracic compression deformities are grossly stable status post  2 level augmentation  IMPRESSION: 1. New bilateral pleural effusions with associated bibasilar atelectasis. 2. Suspected progressive enlargement of the ascending aortic aneurysm compared with prior radiographs and CT. Follow up Chest CTA recommended.   Electronically Signed   By: Camie Patience M.D.   On: 02/12/2014 14:08   Nm Pulmonary Perf And Vent  02/13/2014   CLINICAL DATA:  Shortness of breath  EXAM: NUCLEAR MEDICINE VENTILATION - PERFUSION LUNG SCAN  TECHNIQUE: Ventilation images were obtained in multiple projections using inhaled aerosol technetium 99 M DTPA. Perfusion images were obtained in multiple projections after intravenous injection of Tc-39m MAA.  RADIOPHARMACEUTICALS:  49.5 mCi Tc-53m DTPA aerosol and 5.5 mCi Tc-59m MAA  COMPARISON:  Chest x-ray from 1 day prior  FINDINGS: There is no definite segmental perfusion defect when accounting for pleural effusions and artifact from pacer battery pack in the left chest. There are patchy counts on oblique imaging, but no clear peripheral defect unexplained by artifact or mediastinal widening (there is cardiomegaly and a large ascending aortic aneurysm on comparison radiography). Aerosolized tracer deposition is heterogeneous consistent with airways disease.  IMPRESSION: Low probability for pulmonary embolism.   Electronically Signed   By: Jorje Guild M.D.   On: 02/13/2014 09:10    HOSPITAL DIAGNOSES: Active Problems:   HTN (hypertension)   Coronary disease   Aortic insufficiency   Anemia   Cardiac pacemaker   PAF  (paroxysmal atrial fibrillation)   Dyspnea on exertion   CHF exacerbation   IMPRESSION/RECOMMENDATION: 1. Acute congestive heart failure, probably diastolic - she admits that recently she may been taking in more salty foods including cheese nabs. There also could be a reduction in LV systolic function that could explain her new heart failure. I agree with the recheck her echocardiogram. Another possibility could be pacemaker-induced cardiomyopathy. Agree with continuing increased dose oral Lasix. She has diuresed over 3L.  We may need to decrease the dose prior to discharge. She was previously on lisinopril which is being held due to reduced GFR. I would consider adding hydralazine for additional afterload reduction based on her elevated blood pressures. 2. History of PAF-maintaining sinus - no evidence for recurrent A. fib as a possible cause of her symptoms. 3. Aortic insufficiency - stable murmur, related to ascending aortic aneurysm 4. Thoracic aortic aneurysm - she has a known aortic aneurysm which may be slightly larger on chest x-ray. She is not a candidate for aortic surgery, therefore additional imaging would not be beneficial.  Thanks for consulting [place]. We will follow along with you.  Time Spent Directly with Patient: 30 minutes  Pixie Casino, MD, [facility] Attending Cardiologist [facility]  Royalty Domagala C 02/13/2014, 12:52 PM

## 2014-02-13 NOTE — Progress Notes (Signed)
*  Preliminary Results* Bilateral lower extremity venous duplex completed. Bilateral lower extremities are negative for deep vein thrombosis. There is no evidence of Baker's cyst bilaterally.  02/13/2014  Maudry Mayhew, RVT, RDCS, RDMS

## 2014-02-14 DIAGNOSIS — E43 Unspecified severe protein-calorie malnutrition: Secondary | ICD-10-CM | POA: Insufficient documentation

## 2014-02-14 DIAGNOSIS — R0602 Shortness of breath: Secondary | ICD-10-CM

## 2014-02-14 LAB — CBC
HEMATOCRIT: 31.3 % — AB (ref 36.0–46.0)
Hemoglobin: 9.7 g/dL — ABNORMAL LOW (ref 12.0–15.0)
MCH: 28.1 pg (ref 26.0–34.0)
MCHC: 31 g/dL (ref 30.0–36.0)
MCV: 90.7 fL (ref 78.0–100.0)
Platelets: 253 10*3/uL (ref 150–400)
RBC: 3.45 MIL/uL — ABNORMAL LOW (ref 3.87–5.11)
RDW: 14.2 % (ref 11.5–15.5)
WBC: 5 10*3/uL (ref 4.0–10.5)

## 2014-02-14 MED ORDER — BOOST PLUS PO LIQD
237.0000 mL | Freq: Two times a day (BID) | ORAL | Status: DC
  Administered 2014-02-14: 237 mL via ORAL
  Filled 2014-02-14 (×3): qty 237

## 2014-02-14 NOTE — Progress Notes (Signed)
INITIAL NUTRITION ASSESSMENT  DOCUMENTATION CODES Per approved criteria  -Severe malnutrition in the context of chronic illness  Pt meets criteria for severe MALNUTRITION in the context of chronic illness as evidenced by severe fat and muscle wasting.  INTERVENTION: - Boost Plus BID  NUTRITION DIAGNOSIS: Inadequate oral intake related to chronic illness as evidenced by poor appetite.   Goal: Pt to meet >/= 90% of their estimated nutrition needs   Monitor:  Weight trend, po intake, acceptance of supplements, labs  Reason for Assessment: low BMI  81 y.o. female  Admitting Dx: <principal problem not specified>  ASSESSMENT: - Pt admitted with shortness of breath likely secondary to acute on chronic diastolic heart failure. - She was eating lunch during RD visit. Reported poor appetite. She reports that she drinks Boost supplements at home. Unsure what her usual body weight is, but reports weight loss due to lasix and fluid loss.  - pt reports no problems with chewing or swallowing - Pt agreed to drink Boost supplements while in the hospital.  Nutrition Focused Physical Exam:  Subcutaneous Fat:  Orbital Region: moderate to severe depletion Upper Arm Region: severe depletion Thoracic and Lumbar Region: n/a  Muscle:  Temple Region: severe depletion Clavicle Bone Region: severe depletion Clavicle and Acromion Bone Region: moderate to severe depletion Scapular Bone Region: n/a Dorsal Hand: severe depletion Patellar Region: severe depletion Anterior Thigh Region: severe depletion Posterior Calf Region: severe depletion  Edema: none   Height: Ht Readings from Last 1 Encounters:  02/12/14 5\' 6"  (1.676 m)    Weight: Wt Readings from Last 1 Encounters:  02/14/14 94 lb 12.8 oz (43.001 kg)    Ideal Body Weight: 59.3 kg  % Ideal Body Weight: 73%  Wt Readings from Last 10 Encounters:  02/14/14 94 lb 12.8 oz (43.001 kg)  12/28/13 100 lb (45.36 kg)  10/16/13 100 lb 1.9  oz (45.414 kg)  08/29/13 96 lb 12.8 oz (43.908 kg)  07/20/13 102 lb (46.267 kg)  06/29/13 101 lb (45.813 kg)  12/29/12 99 lb (44.906 kg)  12/20/12 99 lb (44.906 kg)  09/15/12 99 lb (44.906 kg)  09/15/12 99 lb (44.906 kg)    BMI:  Body mass index is 15.31 kg/(m^2).  Estimated Nutritional Needs: Kcal: 1300-1500 Protein: 60-70 g Fluid: 1.5 L/day  Skin: intact  Diet Order: Diet Heart  EDUCATION NEEDS: -Education needs addressed   Intake/Output Summary (Last 24 hours) at 02/14/14 1228 Last data filed at 02/14/14 0900  Gross per 24 hour  Intake    720 ml  Output   1250 ml  Net   -530 ml    Last BM: 2/3   Labs:   Recent Labs Lab 02/12/14 1234 02/13/14 0540  NA 138 141  K 4.4 4.5  CL 102 99  CO2 29 34*  BUN 42* 39*  CREATININE 0.83 0.99  CALCIUM 8.7 9.0  GLUCOSE 95 104*    CBG (last 3)  No results for input(s): GLUCAP in the last 72 hours.  Scheduled Meds: . aspirin EC  81 mg Oral Daily  . enoxaparin (LOVENOX) injection  30 mg Subcutaneous Q24H  . furosemide  40 mg Oral Daily  . guaiFENesin  600 mg Oral BID  . isosorbide mononitrate  30 mg Oral Daily  . metoprolol tartrate  12.5 mg Oral BID  . pravastatin  10 mg Oral q1800    Continuous Infusions:   Past Medical History  Diagnosis Date  . Hypertension   . Hyperlipidemia   .  Arthritis   . Macular degeneration   . Aortic insufficiency   . Vitamin D deficiency   . Coronary artery disease   . LBBB (left bundle branch block)   . Ascending aortic aneurysm   . Hip fracture   . Paroxysmal atrial fibrillation   . Tachycardia-bradycardia     s/pPPM    Past Surgical History  Procedure Laterality Date  . Appendectomy    . Abdominal hysterectomy    . Fracture surgery      left hip  . Eye surgery      cataracts  . Coronary artery bypass graft  11/11/1992    EF 70%  . Transthoracic echocardiogram  08/18/2009    EF 60-65%  . US echocardiography  07/22/2009    EF 55-60%  . Transthoracic  echocardiogram  05/02/2009    EF 45-50%  . US echocardiography  07/18/2008    EF 55-60%  . US echocardiography  07/14/2007    EF  55-60%  . US echocardiography  06/09/2006    EF 55-60%  . Cardiovascular stress test  08/21/2002    EF 65%, NO ISCHEMIA  . Pacemaker insertion  06/2010    Byng 60 VVI pacemaker implanted by Dr Willis Modena at [facility] for tachy/brady syndrome    Laurette Schimke MS, RD, LDN

## 2014-02-14 NOTE — Progress Notes (Signed)
DAILY PROGRESS NOTE  Subjective:  Diuresed another 1.1L negative - now 3.3L net negative. Weight is down 4 lbs since admission. BMP is pending today.  Objective:  Temp:  [98 F (36.7 C)-98.3 F (36.8 C)] 98 F (36.7 C) (02/03 0436) Pulse Rate:  [58-68] 68 (02/03 0436) Resp:  [16-18] 18 (02/03 0436) BP: (117-137)/(45-59) 137/45 mmHg (02/03 0436) SpO2:  [92 %-96 %] 93 % (02/03 0436) Weight:  [94 lb 12.8 oz (43.001 kg)] 94 lb 12.8 oz (43.001 kg) (02/03 0436) Weight change: -5 lb 3.2 oz (-2.359 kg)  Intake/Output from previous day: 02/02 0701 - 02/03 0700 In: 720 [P.O.:720] Out: 1850 [Urine:1850]  Intake/Output from this shift:    Medications: Current Facility-Administered Medications  Medication Dose Route Frequency Provider Last Rate Last Dose  . ALPRAZolam Duanne Moron) tablet 0.25 mg  0.25 mg Oral TID PRN Reyne Dumas, MD   0.25 mg at 02/12/14 2345  . aspirin EC tablet 81 mg  81 mg Oral Daily Reyne Dumas, MD   81 mg at 02/13/14 1052  . enoxaparin (LOVENOX) injection 30 mg  30 mg Subcutaneous Q24H Luiz Ochoa, RPH   30 mg at 02/13/14 2211  . furosemide (LASIX) tablet 40 mg  40 mg Oral Daily Reyne Dumas, MD      . guaiFENesin (MUCINEX) 12 hr tablet 600 mg  600 mg Oral BID Gardiner Barefoot, NP   600 mg at 02/13/14 2208  . HYDROcodone-acetaminophen (NORCO/VICODIN) 5-325 MG per tablet 1 tablet  1 tablet Oral Q6H PRN Reyne Dumas, MD   1 tablet at 02/13/14 0320  . isosorbide mononitrate (IMDUR) 24 hr tablet 30 mg  30 mg Oral Daily Reyne Dumas, MD   30 mg at 02/13/14 1052  . levalbuterol (XOPENEX) nebulizer solution 0.63 mg  0.63 mg Nebulization Q4H PRN Reyne Dumas, MD      . metoprolol tartrate (LOPRESSOR) tablet 12.5 mg  12.5 mg Oral BID Reyne Dumas, MD   12.5 mg at 02/13/14 2208  . pravastatin (PRAVACHOL) tablet 10 mg  10 mg Oral q1800 Reyne Dumas, MD   10 mg at 02/13/14 1759  . traZODone (DESYREL) tablet 25 mg  25 mg Oral QHS PRN Reyne Dumas, MD        Physical  Exam: General appearance: alert and no distress Neck: no carotid bruit and no JVD Lungs: clear to auscultation bilaterally Heart: regular rate and rhythm Extremities: extremities normal, atraumatic, no cyanosis or edema  Lab Results: Results for orders placed or performed during the hospital encounter of 02/12/14 (from the past 48 hour(s))  D-dimer, quantitative     Status: Abnormal   Collection Time: 02/12/14 12:30 PM  Result Value Ref Range   D-Dimer, Quant 3.26 (H) 0.00 - 0.48 ug/mL-FEU    Comment:        AT THE INHOUSE ESTABLISHED CUTOFF VALUE OF 0.48 ug/mL FEU, THIS ASSAY HAS BEEN DOCUMENTED IN THE LITERATURE TO HAVE A SENSITIVITY AND NEGATIVE PREDICTIVE VALUE OF AT LEAST 98 TO 99%.  THE TEST RESULT SHOULD BE CORRELATED WITH AN ASSESSMENT OF THE CLINICAL PROBABILITY OF DVT / VTE.   Comprehensive metabolic panel     Status: Abnormal   Collection Time: 02/12/14 12:34 PM  Result Value Ref Range   Sodium 138 135 - 145 mmol/L   Potassium 4.4 3.5 - 5.1 mmol/L   Chloride 102 96 - 112 mmol/L   CO2 29 19 - 32 mmol/L   Glucose, Bld 95 70 - 99 mg/dL  BUN 42 (H) 6 - 23 mg/dL   Creatinine, Ser 0.83 0.50 - 1.10 mg/dL   Calcium 8.7 8.4 - 10.5 mg/dL   Total Protein 6.4 6.0 - 8.3 g/dL   Albumin 3.3 (L) 3.5 - 5.2 g/dL   AST 20 0 - 37 U/L   ALT 12 0 - 35 U/L   Alkaline Phosphatase 67 39 - 117 U/L   Total Bilirubin 0.7 0.3 - 1.2 mg/dL   GFR calc non Af Amer 56 (L) >90 mL/min   GFR calc Af Amer 65 (L) >90 mL/min    Comment: (NOTE) The eGFR has been calculated using the CKD EPI equation. This calculation has not been validated in all clinical situations. eGFR's persistently <90 mL/min signify possible Chronic Kidney Disease.    Anion gap 7 5 - 15  CBC with Differential     Status: Abnormal   Collection Time: 02/12/14 12:34 PM  Result Value Ref Range   WBC 5.3 4.0 - 10.5 K/uL   RBC 3.32 (L) 3.87 - 5.11 MIL/uL   Hemoglobin 9.3 (L) 12.0 - 15.0 g/dL   HCT 30.1 (L) 36.0 - 46.0 %     MCV 90.7 78.0 - 100.0 fL   MCH 28.0 26.0 - 34.0 pg   MCHC 30.9 30.0 - 36.0 g/dL   RDW 14.1 11.5 - 15.5 %   Platelets 221 150 - 400 K/uL   Neutrophils Relative % 78 (H) 43 - 77 %   Neutro Abs 4.1 1.7 - 7.7 K/uL   Lymphocytes Relative 11 (L) 12 - 46 %   Lymphs Abs 0.6 (L) 0.7 - 4.0 K/uL   Monocytes Relative 8 3 - 12 %   Monocytes Absolute 0.4 0.1 - 1.0 K/uL   Eosinophils Relative 3 0 - 5 %   Eosinophils Absolute 0.1 0.0 - 0.7 K/uL   Basophils Relative 0 0 - 1 %   Basophils Absolute 0.0 0.0 - 0.1 K/uL  Brain natriuretic peptide     Status: Abnormal   Collection Time: 02/12/14 12:34 PM  Result Value Ref Range   B Natriuretic Peptide 838.4 (H) 0.0 - 100.0 pg/mL  I-stat troponin, ED     Status: None   Collection Time: 02/12/14 12:43 PM  Result Value Ref Range   Troponin i, poc 0.01 0.00 - 0.08 ng/mL   Comment 3            Comment: Due to the release kinetics of cTnI, a negative result within the first hours of the onset of symptoms does not rule out myocardial infarction with certainty. If myocardial infarction is still suspected, repeat the test at appropriate intervals.   Urinalysis, Routine w reflex microscopic     Status: Abnormal   Collection Time: 02/12/14  2:54 PM  Result Value Ref Range   Color, Urine YELLOW YELLOW   APPearance CLEAR CLEAR   Specific Gravity, Urine 1.012 1.005 - 1.030   pH 5.0 5.0 - 8.0   Glucose, UA NEGATIVE NEGATIVE mg/dL   Hgb urine dipstick NEGATIVE NEGATIVE   Bilirubin Urine NEGATIVE NEGATIVE   Ketones, ur NEGATIVE NEGATIVE mg/dL   Protein, ur NEGATIVE NEGATIVE mg/dL   Urobilinogen, UA 0.2 0.0 - 1.0 mg/dL   Nitrite NEGATIVE NEGATIVE   Leukocytes, UA TRACE (A) NEGATIVE  Urine microscopic-add on     Status: Abnormal   Collection Time: 02/12/14  2:54 PM  Result Value Ref Range   Squamous Epithelial / LPF RARE RARE   WBC, UA 3-6 <3 WBC/hpf  RBC / HPF 3-6 <3 RBC/hpf   Bacteria, UA RARE RARE   Casts HYALINE CASTS (A) NEGATIVE  Troponin I      Status: None   Collection Time: 02/12/14  5:59 PM  Result Value Ref Range   Troponin I <0.03 <0.031 ng/mL    Comment:        NO INDICATION OF MYOCARDIAL INJURY.   TSH     Status: None   Collection Time: 02/12/14  6:12 PM  Result Value Ref Range   TSH 1.587 0.350 - 4.500 uIU/mL    Comment: Performed at [facility]  Troponin I     Status: None   Collection Time: 02/12/14 11:39 PM  Result Value Ref Range   Troponin I <0.03 <0.031 ng/mL    Comment:        NO INDICATION OF MYOCARDIAL INJURY.   Troponin I     Status: None   Collection Time: 02/13/14  5:40 AM  Result Value Ref Range   Troponin I <0.03 <0.031 ng/mL    Comment:        NO INDICATION OF MYOCARDIAL INJURY.   Basic metabolic panel     Status: Abnormal   Collection Time: 02/13/14  5:40 AM  Result Value Ref Range   Sodium 141 135 - 145 mmol/L   Potassium 4.5 3.5 - 5.1 mmol/L   Chloride 99 96 - 112 mmol/L   CO2 34 (H) 19 - 32 mmol/L   Glucose, Bld 104 (H) 70 - 99 mg/dL   BUN 39 (H) 6 - 23 mg/dL   Creatinine, Ser 0.99 0.50 - 1.10 mg/dL   Calcium 9.0 8.4 - 10.5 mg/dL   GFR calc non Af Amer 46 (L) >90 mL/min   GFR calc Af Amer 53 (L) >90 mL/min    Comment: (NOTE) The eGFR has been calculated using the CKD EPI equation. This calculation has not been validated in all clinical situations. eGFR's persistently <90 mL/min signify possible Chronic Kidney Disease.    Anion gap 8 5 - 15  CBC     Status: Abnormal   Collection Time: 02/14/14  5:55 AM  Result Value Ref Range   WBC 5.0 4.0 - 10.5 K/uL   RBC 3.45 (L) 3.87 - 5.11 MIL/uL   Hemoglobin 9.7 (L) 12.0 - 15.0 g/dL   HCT 31.3 (L) 36.0 - 46.0 %   MCV 90.7 78.0 - 100.0 fL   MCH 28.1 26.0 - 34.0 pg   MCHC 31.0 30.0 - 36.0 g/dL   RDW 14.2 11.5 - 15.5 %   Platelets 253 150 - 400 K/uL    Imaging: Dg Chest 2 View  02/12/2014   CLINICAL DATA:  Ascending aortic aneurysm with intermittent shortness of breath and weakness for 1 week. History of hypertension.  Initial encounter.  EXAM: CHEST  2 VIEW  COMPARISON:  09/15/2012 radiographs.  Chest CT 05/02/2009.  FINDINGS: The left subclavian pacemaker lead appears unchanged. There is cardiomegaly and diffuse aortic atherosclerosis. Uncorrected for magnification, the ascending aorta measures 8.8 cm on the lateral view, increased from the prior radiographs at which time it measure approximately 7.5 cm. There is chronic lung disease with pleural parenchymal scarring and superimposed bibasilar atelectasis. There are new small bilateral pleural effusions. Multiple thoracic compression deformities are grossly stable status post 2 level augmentation  IMPRESSION: 1. New bilateral pleural effusions with associated bibasilar atelectasis. 2. Suspected progressive enlargement of the ascending aortic aneurysm compared with prior radiographs and CT. Follow up Chest CTA  recommended.   Electronically Signed   By: Camie Patience M.D.   On: 02/12/2014 14:08   Nm Pulmonary Perf And Vent  02/13/2014   CLINICAL DATA:  Shortness of breath  EXAM: NUCLEAR MEDICINE VENTILATION - PERFUSION LUNG SCAN  TECHNIQUE: Ventilation images were obtained in multiple projections using inhaled aerosol technetium 99 M DTPA. Perfusion images were obtained in multiple projections after intravenous injection of Tc-62m MAA.  RADIOPHARMACEUTICALS:  49.5 mCi Tc-57m DTPA aerosol and 5.5 mCi Tc-58m MAA  COMPARISON:  Chest x-ray from 1 day prior  FINDINGS: There is no definite segmental perfusion defect when accounting for pleural effusions and artifact from pacer battery pack in the left chest. There are patchy counts on oblique imaging, but no clear peripheral defect unexplained by artifact or mediastinal widening (there is cardiomegaly and a large ascending aortic aneurysm on comparison radiography). Aerosolized tracer deposition is heterogeneous consistent with airways disease.  IMPRESSION: Low probability for pulmonary embolism.   Electronically Signed   By: Jorje Guild M.D.   On: 02/13/2014 09:10    Assessment:  1. Active Problems: 2.   HTN (hypertension) 3.   Coronary disease 4.   Aortic insufficiency 5.   Anemia 6.   Cardiac pacemaker 7.   PAF (paroxysmal atrial fibrillation) 8.   Dyspnea on exertion 9.   CHF exacerbation 10.   Plan:  1. Awaiting results of 2D echo - appears near euvolemic today. She is on po lasix 40 mg daily. BP seems well controlled. Consider restarting lisinopril based on creatinine result.  She feels weak - may need PT evaluation to determine if she is able to go home.   Time Spent Directly with Patient:  15 minutes  Length of Stay:  LOS: 2 days   Pixie Casino, MD, [facility] Attending Cardiologist [facility]  Amparo Donalson C 02/14/2014, 7:30 AM

## 2014-02-14 NOTE — Clinical Documentation Improvement (Signed)
02/14/14 Nutrition eval..Marland Kitchen"Pt meets criteria for severe MALNUTRITION in the context of chronic illness as evidenced by severe fat and muscle wasting.".Marland KitchenMarland Kitchen"INTERVENTION:- Boost Plus BID"..."Monitor: Weight trend, po intake, acceptance of supplements, labs"...  For accurate Dx specificity & severity can noted nutrition eval w/ nutrition dx be further validated for condition being eval'd, mon'd & tx'd. Thank you  Possible conditions:  --Protein Calorie Malnutrition  . Severity: --Mild (first degree) --Moderate (second degree) --Severe (third degree) . Avoid documenting a range of severity, such as "moderate to severe" --Malnutrition --Underweight --Other --Unable to determine . Document any associated diagnoses/conditions  Supporting Information: See above note  Thank You, Ermelinda Das, RN, BSN, CCDS Certified Clinical Documentation Specialist Pager: [phone] [place]: Health Information Management

## 2014-02-14 NOTE — Progress Notes (Signed)
Occupational Therapy Treatment Patient Details Name: Shea Swalley MRN: [id number] DOB: 11-05-1912 Today's Date: 02/14/2014    History of present illness 81 year old female was admitted for CHF exacerbation.  She has a h/o A-Fib, HTN, pacemaker   OT comments  Performed bathing and toilet transfer this session.  Pt tolerated well.  Min guard to Min A needed this session for ADLs and mobility  Follow Up Recommendations  SNF (pt cannot have 24/7. She would benefit from STSNF if agreeable)   If pt refuses, then STSNF   Equipment Recommendations  3 in 1 bedside comode    Recommendations for Other Services      Precautions / Restrictions Precautions Precautions: Fall Restrictions Weight Bearing Restrictions: No       Mobility Bed Mobility Overal bed mobility: Needs Assistance Bed Mobility: Supine to Sit;Sit to Supine     Supine to sit: Supervision;HOB elevated Sit to supine: Supervision;HOB elevated   General bed mobility comments: extra time; supervision for safey; HOB raised  Transfers Overall transfer level: Needs assistance Equipment used: Rolling walker (2 wheeled) Transfers: Sit to/from Stand Sit to Stand: Min guard         General transfer comment: close guard for safety. VCs hand placement    Balance                                   ADL       Grooming: Oral care;Supervision/safety;Standing       Lower Body Bathing: Minimal assistance;Sit to/from stand           Toilet Transfer: Min guard;Stand-pivot;BSC             General ADL Comments: Pt completed bathing from 3:1 sit to stand.  Her caregiver comes 2 hours x 3 days a week, otherwise pt washes up as she can.  Pt has macular degeneration:  min A to ambulate to sink for safety.        Vision                     Perception     Praxis      Cognition   Behavior During Therapy: WFL for tasks assessed/performed Overall Cognitive Status: Within Functional Limits  for tasks assessed                       Extremity/Trunk Assessment  Upper Extremity Assessment Upper Extremity Assessment: Defer to OT evaluation      Cervical / Trunk Assessment Cervical / Trunk Assessment: Kyphotic    Exercises     Shoulder Instructions       General Comments      Pertinent Vitals/ Pain       Pain Assessment: No/denies pain Faces Pain Scale: Hurts little more Pain Location: back-chronic per pt Pain Descriptors / Indicators: Aching Pain Intervention(s): Monitored during session  Home Living Family/patient expects to be discharged to:: Private residence Living Arrangements: Alone Available Help at Discharge: Personal care attendant (2 hours day/3x week)                         Home Equipment: Walker - 2 wheels          Prior Functioning/Environment Level of Independence: Independent with assistive device(s)            Frequency Min 2X/week     Progress  Toward Goals  OT Goals(current goals can now be found in the care plan section)  Progress towards OT goals: Progressing toward goals  Acute Rehab OT Goals Patient Stated Goal: get strength back  Plan Frequency needs to be updated    Co-evaluation                 End of Session     Activity Tolerance Patient tolerated treatment well   Patient Left in bed;with bed alarm set;with call bell/phone within reach   Nurse Communication          Time: 1352-1416 OT Time Calculation (min): 24 min  Charges: OT General Charges $OT Visit: 1 Procedure OT Treatments $Self Care/Home Management : 23-37 mins  Aalijah Mims 02/14/2014, 2:23 PM  Lesle Chris, OTR/L [phone] 02/14/2014

## 2014-02-14 NOTE — Progress Notes (Signed)
[facility] PROGRESS NOTE  Felicia [name] ZOX:[id number] DOB: 1912/05/13 DOA: 02/12/2014 PCP: [name], MD  Assessment/Plan: Active Problems:   HTN (hypertension)   Coronary disease   Aortic insufficiency   Anemia   Cardiac pacemaker   PAF (paroxysmal atrial fibrillation)   Dyspnea on exertion   CHF exacerbation   Acute on chronic diastolic heart failure/Cor pulmonale,  EF 02/14/14=55% Pa Peak 109!!? Mm/hg D-dimer elevated VQ scan reviewed, low probability for PE, venous Doppler 02/13/14 neg Symptoms improved with IV Lasix, start the patient on Lasix 40 mg a day Appreciate Dr. Debara Pickett input 2/2 to Cor Pulmonale might need home O2  NSVT 02/14/14 [employer] requested to interrogate PPM-Runs of SVT ~ 30 sec ~ 15:10 pm Patient aysmptomatic Check Am Magnesium as on diuretic   HTN (hypertension)-continue Imdur30 daily, Lopressor 12.5bid Ace relatively contraindicated 2/2 to Ck-Glt Equation CKD stg 4  CKD stage 4 Still producing urine Monitor am labs   Coronary disease-cardiac enzymes reviewed, they are negative, continue Lopressor   Aortic insufficiency c severely dilated ascending Aorta- Unclear if a great candidate for surgery Will discuss with Cardiology   Anemia-hemoglobin at baseline of 9.8-10.4   PAF (paroxysmal atrial fibrillation) c Tachybrady syndrome s/p PM 06/2010-rate controlled, continue beta blocker, not on anticoagulation as mentioned in history of present illness  Code Status: full Family Communication:  Disposition Plan: PT/OT evaluation   Brief narrative: HPI:  81 year old woman , history of paroxysmal atrial fibrillation(CHADS2VASC score of at least 4. She and her daughter decline anticoagulation ) ,tachy-bradycardia syndrome with functioning pacemaker implanted 06/23/10 at [facility],  chronic diastolic heart failure with increased shortness of breath and peripheral edema, Last echocardiogram 08/18/09 showed normal systolic function  with grade 1 diastolic dysfunction left bundle-branch block , ischemic heart disease status post angioplasty of LAD in 1994, hx of ascending aortic aneurysm and a known murmur of aortic insufficiency. Because of her age and comorbidities, these problems are being managed medically by her cardiologist Dr Mare Ferrari.  Started on lasix 8/15, admitted /1/16 c3/7 h/o DOE to [place] at rest . - f,-chill,-v,-diarr  Consultants:  None  Procedures:  None  Antibiotics: None  HPI/Subjective:  Doing fair Slight desat earlier today so placed back on O2 Tol diet Oriented x 4 No cp, no n No V Daughter who is a nurse is at the bedside  Objective: Filed Vitals:   02/13/14 1951 02/14/14 0436 02/14/14 1031 02/14/14 1429  BP: 129/48 137/45 143/49 117/40  Pulse: 61 68 60 54  Temp: 98.2 F (36.8 C) 98 F (36.7 C)  98.2 F (36.8 C)  TempSrc: Oral Oral  Oral  Resp: 18 18  16   Height:      Weight:  43.001 kg (94 lb 12.8 oz)    SpO2: 92% 93%  94%    Intake/Output Summary (Last 24 hours) at 02/14/14 1602 Last data filed at 02/14/14 1300  Gross per 24 hour  Intake    720 ml  Output   1200 ml  Net   -480 ml    Exam:  General: No acute respiratory distress Lungs: Clear to auscultation bilaterally without wheezes or crackles Cardiovascular: Regular rate and rhythm without murmur gallop or rub normal S1 and S2 Abdomen: Nontender, nondistended, soft, bowel sounds positive, no rebound, no ascites, no appreciable mass Extremities: No significant cyanosis, clubbing, or edema bilateral lower extremities      Data Reviewed: Basic Metabolic Panel:  Recent Labs Lab 02/12/14 1234 02/13/14 0540  NA 138 141  K 4.4 4.5  CL 102 99  CO2 29 34*  GLUCOSE 95 104*  BUN 42* 39*  CREATININE 0.83 0.99  CALCIUM 8.7 9.0    Liver Function Tests:  Recent Labs Lab 02/12/14 1234  AST 20  ALT 12  ALKPHOS 67  BILITOT 0.7  PROT 6.4  ALBUMIN 3.3*   No results for input(s): LIPASE, AMYLASE in  the last 168 hours. No results for input(s): AMMONIA in the last 168 hours.  CBC:  Recent Labs Lab 02/12/14 1234 02/14/14 0555  WBC 5.3 5.0  NEUTROABS 4.1  --   HGB 9.3* 9.7*  HCT 30.1* 31.3*  MCV 90.7 90.7  PLT 221 253    Cardiac Enzymes:  Recent Labs Lab 02/12/14 1759 02/12/14 2339 02/13/14 0540  TROPONINI <0.03 <0.03 <0.03   BNP (last 3 results)  Recent Labs  02/12/14 1234  BNP 838.4*    ProBNP (last 3 results) No results for input(s): PROBNP in the last 8760 hours.    CBG: No results for input(s): GLUCAP in the last 168 hours.  No results found for this or any previous visit (from the past 240 hour(s)).   Studies: Dg Chest 2 View  02/12/2014   CLINICAL DATA:  Ascending aortic aneurysm with intermittent shortness of breath and weakness for 1 week. History of hypertension. Initial encounter.  EXAM: CHEST  2 VIEW  COMPARISON:  09/15/2012 radiographs.  Chest CT 05/02/2009.  FINDINGS: The left subclavian pacemaker lead appears unchanged. There is cardiomegaly and diffuse aortic atherosclerosis. Uncorrected for magnification, the ascending aorta measures 8.8 cm on the lateral view, increased from the prior radiographs at which time it measure approximately 7.5 cm. There is chronic lung disease with pleural parenchymal scarring and superimposed bibasilar atelectasis. There are new small bilateral pleural effusions. Multiple thoracic compression deformities are grossly stable status post 2 level augmentation  IMPRESSION: 1. New bilateral pleural effusions with associated bibasilar atelectasis. 2. Suspected progressive enlargement of the ascending aortic aneurysm compared with prior radiographs and CT. Follow up Chest CTA recommended.   Electronically Signed   By: Camie Patience M.D.   On: 02/12/2014 14:08   Nm Pulmonary Perf And Vent  02/13/2014   CLINICAL DATA:  Shortness of breath  EXAM: NUCLEAR MEDICINE VENTILATION - PERFUSION LUNG SCAN  TECHNIQUE: Ventilation images were  obtained in multiple projections using inhaled aerosol technetium 99 M DTPA. Perfusion images were obtained in multiple projections after intravenous injection of Tc-25m MAA.  RADIOPHARMACEUTICALS:  49.5 mCi Tc-50m DTPA aerosol and 5.5 mCi Tc-23m MAA  COMPARISON:  Chest x-ray from 1 day prior  FINDINGS: There is no definite segmental perfusion defect when accounting for pleural effusions and artifact from pacer battery pack in the left chest. There are patchy counts on oblique imaging, but no clear peripheral defect unexplained by artifact or mediastinal widening (there is cardiomegaly and a large ascending aortic aneurysm on comparison radiography). Aerosolized tracer deposition is heterogeneous consistent with airways disease.  IMPRESSION: Low probability for pulmonary embolism.   Electronically Signed   By: Jorje Guild M.D.   On: 02/13/2014 09:10    Scheduled Meds: . aspirin EC  81 mg Oral Daily  . enoxaparin (LOVENOX) injection  30 mg Subcutaneous Q24H  . furosemide  40 mg Oral Daily  . guaiFENesin  600 mg Oral BID  . isosorbide mononitrate  30 mg Oral Daily  . lactose free nutrition  237 mL Oral BID BM  . metoprolol tartrate  12.5 mg Oral BID  .  pravastatin  10 mg Oral q1800   Continuous Infusions:   Active Problems:   HTN (hypertension)   Coronary disease   Aortic insufficiency   Anemia   Cardiac pacemaker   PAF (paroxysmal atrial fibrillation)   Dyspnea on exertion   CHF exacerbation    Time spent: 40 minutes  Verneita Griffes, MD [facility] [phone]

## 2014-02-14 NOTE — Progress Notes (Signed)
Clinical Social Work Department BRIEF PSYCHOSOCIAL ASSESSMENT 02/14/2014  Patient:  Felicia [name],Felicia [name]     Account Number:  [id number]     Admit date:  02/12/2014  Clinical Social Worker:  Renold Genta  Date/Time:  02/14/2014 04:12 PM  Referred by:  Physician  Date Referred:  02/14/2014 Referred for  SNF Placement   Other Referral:   Interview type:  Patient Other interview type:   and grandaughter, Rise Paganini at bedside & son, Patrick Jupiter via phone    PSYCHOSOCIAL DATA Living Status:  ALONE Admitted from facility:   Level of care:   Primary support name:  Shalisha Clausing (son) c#: [phone] w#: [phone] h#: [id number] Primary support relationship to patient:  CHILD, ADULT Degree of support available:   good    CURRENT CONCERNS Current Concerns  Post-Acute Placement   Other Concerns:    SOCIAL WORK ASSESSMENT / PLAN CSW reviewed PT evaluation recommending SNF at discharge.   Assessment/plan status:  Information/Referral to [employer] Other assessment/ plan:   Information/referral to community resources:   CSW completed FL2 and faxed information out to [facility] - provided SNF list, awaiting bed offers.    PATIENT'S/FAMILY'S RESPONSE TO PLAN OF CARE: Patient informed CSW that she had been to [facility] in the past following a hip fracture, was pleased with the facility. Patient states that if she were to go to a SNF, that she would prefer there - awaiting call back re: bed availability. Patient's son to talk to his mother & patient's grandaughter, Rise Paganini re: discharge plan. Patient lives alone in [place], states that she has a caregiver that comes out to the house about 3 times a week for 2 hours through the [employer] program ([employer] is the agency).          Raynaldo Opitz, [facility] Clinical Social Worker cell #: [phone]

## 2014-02-14 NOTE — Progress Notes (Signed)
  Echocardiogram 2D Echocardiogram has been performed.  Felicia [name] Grade 02/14/2014, 12:01 PM

## 2014-02-14 NOTE — Progress Notes (Signed)
Clinical Social Work Department CLINICAL SOCIAL WORK PLACEMENT NOTE 02/14/2014  Patient:  Felicia [name],Felicia [name]  Account Number:  [id number] Admit date:  02/12/2014  Clinical Social Worker:  Renold Genta  Date/time:  02/14/2014 04:15 PM  Clinical Social Work is seeking post-discharge placement for this patient at the following level of care:   SKILLED NURSING   (*CSW will update this form in Epic as items are completed)   02/14/2014  Patient/family provided with [place] Department of Clinical Social Work's list of facilities offering this level of care within the geographic area requested by the patient (or if unable, by the patient's family).  02/14/2014  Patient/family informed of their freedom to choose among providers that offer the needed level of care, that participate in Medicare, Medicaid or managed care program needed by the patient, have an available bed and are willing to accept the patient.  02/14/2014  Patient/family informed of [facility]' ownership interest in [facility], as well as of the fact that they are under no obligation to receive care at this facility.  PASARR submitted to EDS on 02/14/2014 PASARR number received on 02/14/2014  FL2 transmitted to all facilities in geographic area requested by pt/family on  02/14/2014 FL2 transmitted to all facilities within larger geographic area on   Patient informed that his/her managed care company has contracts with or will negotiate with  certain facilities, including the following:     Patient/family informed of bed offers received:   Patient chooses bed at  Physician recommends and patient chooses bed at    Patient to be transferred to  on   Patient to be transferred to facility by  Patient and family notified of transfer on  Name of family member notified:    The following physician request were entered in Epic:   Additional Comments:    Raynaldo Opitz, [place] Social Worker cell #: [phone]

## 2014-02-14 NOTE — Evaluation (Signed)
Physical Therapy Evaluation Patient Details Name: Felicia [name] MRN: [id number] DOB: 1912-04-22 Today's Date: 02/14/2014   History of Present Illness  81 year old female was admitted for CHF exacerbation.  She has a h/o A-Fib, HTN, pacemaker  Clinical Impression  On eval, pt required Min guard assist for mobility-able to ambulate ~125 feet with RW. Pt fatigues easily and is generally weaker than baseline. Would need to be mobilizing at a supervision level, at the least, to d/c home safely-pt lives alone.  O2 sats 83% on RA during ambulation. Discussed d/c plan with pt-she states she would think about rehab.     Follow Up Recommendations SNF ([facility] if pt will agree-pt could benefit. If not agreeable,then HHPT.)    Equipment Recommendations  None recommended by PT    Recommendations for Other Services OT consult     Precautions / Restrictions Precautions Precautions: Fall Restrictions Weight Bearing Restrictions: No      Mobility  Bed Mobility Overal bed mobility: Needs Assistance Bed Mobility: Supine to Sit;Sit to Supine     Supine to sit: Supervision;HOB elevated Sit to supine: Supervision;HOB elevated   General bed mobility comments: extra time; supervision for safey  Transfers Overall transfer level: Needs assistance Equipment used: Rolling walker (2 wheeled) Transfers: Sit to/from Stand Sit to Stand: Min guard         General transfer comment: close guard for safety. VCs hand placement  Ambulation/Gait Ambulation/Gait assistance: Min guard Ambulation Distance (Feet): 125 Feet Assistive device: Rolling walker (2 wheeled) Gait Pattern/deviations: Step-through pattern;Trunk flexed;Decreased stride length     General Gait Details: close guard for safety. O2 sats 83% on RA.   Stairs            Wheelchair Mobility    Modified Rankin (Stroke Patients Only)       Balance                                             Pertinent  Vitals/Pain Pain Assessment: Faces Faces Pain Scale: Hurts little more Pain Location: back-chronic per pt Pain Descriptors / Indicators: Aching Pain Intervention(s): Monitored during session    Home Living Family/patient expects to be discharged to:: Private residence Living Arrangements: Alone Available Help at Discharge: Personal care attendant (2 hours day/3x week)           Home Equipment: Gilford Rile - 2 wheels      Prior Function Level of Independence: Independent with assistive device(s)               Hand Dominance        Extremity/Trunk Assessment   Upper Extremity Assessment: Defer to OT evaluation           Lower Extremity Assessment: Generalized weakness      Cervical / Trunk Assessment: Kyphotic  Communication   Communication: No difficulties  Cognition Arousal/Alertness: Awake/alert Behavior During Therapy: WFL for tasks assessed/performed Overall Cognitive Status: Within Functional Limits for tasks assessed                      General Comments      Exercises        Assessment/Plan    PT Assessment Patient needs continued PT services  PT Diagnosis Generalized weakness   PT Problem List Decreased strength;Decreased activity tolerance;Decreased balance;Decreased mobility;Pain  PT Treatment Interventions DME instruction;Gait training;Functional mobility  training;Therapeutic activities;Therapeutic exercise;Patient/family education;Balance training   PT Goals (Current goals can be found in the Care Plan section) Acute Rehab PT Goals Patient Stated Goal: get strength back PT Goal Formulation: With patient Time For Goal Achievement: 02/28/14 Potential to Achieve Goals: Good    Frequency Min 3X/week   Barriers to discharge        Co-evaluation               End of Session Equipment Utilized During Treatment: Gait belt Activity Tolerance: Patient tolerated treatment well Patient left: in bed;with call bell/phone within  reach           Time: 1006-1023 PT Time Calculation (min) (ACUTE ONLY): 17 min   Charges:   PT Evaluation $Initial PT Evaluation Tier I: 1 Procedure     PT G Codes:        Weston Anna, MPT Pager: [phone]

## 2014-02-14 NOTE — Progress Notes (Signed)
Pacemaker or ICD: Pacemaker Manufacturer & Model:[employer] 60 Single or Dual Chamber: single Implant Date:06/23/2010 Implant Diagnosis: sick sinus syndrome Initial Rhythm:sinus rhythm Underlying Rhythm:same Battery Voltage/Imp: Estimated Longevity: >5 yrs Programmed Mode:VVI rate 40 %AS/VS: 100  %AS-VP: %AP-VS: %AP-VP: EPISODES AHR/MS: Comments:

## 2014-02-15 DIAGNOSIS — I509 Heart failure, unspecified: Secondary | ICD-10-CM

## 2014-02-15 DIAGNOSIS — E43 Unspecified severe protein-calorie malnutrition: Secondary | ICD-10-CM

## 2014-02-15 LAB — BASIC METABOLIC PANEL
Anion gap: 11 (ref 5–15)
BUN: 53 mg/dL — ABNORMAL HIGH (ref 6–23)
CALCIUM: 9 mg/dL (ref 8.4–10.5)
CO2: 33 mmol/L — ABNORMAL HIGH (ref 19–32)
Chloride: 96 mmol/L (ref 96–112)
Creatinine, Ser: 0.91 mg/dL (ref 0.50–1.10)
GFR calc Af Amer: 58 mL/min — ABNORMAL LOW (ref >90)
GFR calc non Af Amer: 50 mL/min — ABNORMAL LOW (ref >90)
Glucose, Bld: 119 mg/dL — ABNORMAL HIGH (ref 70–99)
Potassium: 4 mmol/L (ref 3.5–5.1)
Sodium: 140 mmol/L (ref 135–145)

## 2014-02-15 LAB — CBC WITH DIFFERENTIAL/PLATELET
BASOS ABS: 0 10*3/uL (ref 0.0–0.1)
BASOS PCT: 0 % (ref 0–1)
EOS PCT: 3 % (ref 0–5)
Eosinophils Absolute: 0.1 10*3/uL (ref 0.0–0.7)
HEMATOCRIT: 31.8 % — AB (ref 36.0–46.0)
Hemoglobin: 9.7 g/dL — ABNORMAL LOW (ref 12.0–15.0)
LYMPHS ABS: 0.7 10*3/uL (ref 0.7–4.0)
Lymphocytes Relative: 13 % (ref 12–46)
MCH: 27.8 pg (ref 26.0–34.0)
MCHC: 30.5 g/dL (ref 30.0–36.0)
MCV: 91.1 fL (ref 78.0–100.0)
MONOS PCT: 7 % (ref 3–12)
Monocytes Absolute: 0.4 10*3/uL (ref 0.1–1.0)
NEUTROS ABS: 3.9 10*3/uL (ref 1.7–7.7)
NEUTROS PCT: 77 % (ref 43–77)
Platelets: 254 10*3/uL (ref 150–400)
RBC: 3.49 MIL/uL — ABNORMAL LOW (ref 3.87–5.11)
RDW: 14.1 % (ref 11.5–15.5)
WBC: 5.1 10*3/uL (ref 4.0–10.5)

## 2014-02-15 LAB — MAGNESIUM: MAGNESIUM: 1.9 mg/dL (ref 1.5–2.5)

## 2014-02-15 MED ORDER — FUROSEMIDE 20 MG PO TABS: 20.0000 mg | ORAL_TABLET | Freq: Every day | ORAL | Status: AC

## 2014-02-15 MED ORDER — HYDROCODONE-ACETAMINOPHEN 5-325 MG PO TABS: 1.0000 | ORAL_TABLET | Freq: Four times a day (QID) | ORAL | Status: DC | PRN

## 2014-02-15 NOTE — Progress Notes (Signed)
Report was called to [facility]. Patient is being transported via [place] and she is in stable condition.

## 2014-02-15 NOTE — Therapy (Signed)
02/15/14 SATURATION QUALIFICATIONS: (This note is used to comply with regulatory documentation for home oxygen)  Patient Saturations on Room Air at Rest =87%  Patient Saturations on Room Air while Ambulating = 85%  Patient Saturations on 2 Liters of oxygen while Ambulating = 100%  Please briefly explain why patient needs home oxygen:requires oxygen to maintain  Saturation > 90% [facility] [phone]

## 2014-02-15 NOTE — Progress Notes (Signed)
Patient is set to discharge to [facility] today. Patient & son, Felicia [name] aware. Discharge packet given to RN, Catie. [facility] called for transport.   Clinical Social Work Department CLINICAL SOCIAL WORK PLACEMENT NOTE 02/15/2014  Patient:  [name],Felicia  Account Number:  [id number] Admit date:  02/12/2014  Clinical Social Worker:  Renold Genta  Date/time:  02/14/2014 04:15 PM  Clinical Social Work is seeking post-discharge placement for this patient at the following level of care:   SKILLED NURSING   (*CSW will update this form in Epic as items are completed)   02/14/2014  Patient/family provided with [place] Department of Clinical Social Work's list of facilities offering this level of care within the geographic area requested by the patient (or if unable, by the patient's family).  02/14/2014  Patient/family informed of their freedom to choose among providers that offer the needed level of care, that participate in Medicare, Medicaid or managed care program needed by the patient, have an available bed and are willing to accept the patient.  02/14/2014  Patient/family informed of [facility]' ownership interest in [facility], as well as of the fact that they are under no obligation to receive care at this facility.  PASARR submitted to EDS on 02/14/2014 PASARR number received on 02/14/2014  FL2 transmitted to all facilities in geographic area requested by pt/family on  02/14/2014 FL2 transmitted to all facilities within larger geographic area on   Patient informed that his/her managed care company has contracts with or will negotiate with  certain facilities, including the following:     Patient/family informed of bed offers received:  02/15/2014 Patient chooses bed at [facility] Physician recommends and patient chooses bed at    Patient to be transferred to [place] on  02/15/2014 Patient to be transferred to facility by [facility] Patient and  family notified of transfer on 02/15/2014 Name of family member notified:  patient's son, Felicia [name] via phone  The following physician request were entered in Epic:   Additional Comments:   Raynaldo Opitz, [place] Social Worker cell #: [phone]

## 2014-02-15 NOTE — Discharge Summary (Addendum)
Physician Discharge Summary  Felicia [name] XLK:[id number] DOB: November 25, 1912 DOA: 02/12/2014  PCP: [name], MD  Admit date: 02/12/2014 Discharge date: 02/15/2014  Time spent: 35 minutes  Recommendations for Outpatient Follow-up:  1. Will need new dose of Lasix 20 mg daily and close check on intake/output with over the next 1 week with daily weights at facility 2. Obtain basic metabolic panel in 1 week please as was diuresed to less than goal weight during hospital stay 3. Please encourage by mouth intake with shakes and nutritional supplements as has severe malnutrition 4. She should be seen by [name] of cardiology within 7-10 days for further instructions and management of her CHF, aortic aneurysm dilatation, PA peak pressure 109 millimeters mercury.  She also sees Dr. Nehemiah Massed at [facility]. 5. She has CK D stage IV and will need to have this monitored  6. Consider out patient [place] 60 interrogation 7. Will need Home O2 humidified ~2 lpm for Pulm htn/Cor pulmonale 8. Patient will be discharged later today to [place] facility  Discharge Diagnoses:  Active Problems:   HTN (hypertension)   Coronary disease   Aortic insufficiency   Anemia   Cardiac pacemaker   PAF (paroxysmal atrial fibrillation)   Dyspnea on exertion   CHF exacerbation   Protein-calorie malnutrition, severe   Discharge Condition: Stable  Diet recommendation: Heart healthy less than 2 g salt diet  Filed Weights   02/13/14 0500 02/14/14 0436 02/15/14 0623  Weight: 44.543 kg (98 lb 3.2 oz) 43.001 kg (94 lb 12.8 oz) 43 kg (94 lb 12.8 oz)    History of present illness:  81 y/o ?, PAF + Tachy-brady s/p PPM, [place] 2 vasc=4 not on anticoagulation, chronic diastolic heart failure, left bundle branch, ischemic CAD S/P angioplasty 1994, ascending aortic aneurysm admitted to the hospital 02/12/14 with progressive shortness of breath and lower extremity swelling and oxygen  desats to 78% Lasix had been started on patient at 20 mg or 10 mg in August 2015 but she was admitted to the hospital 02/12/14 with a three-day history of dyspnea on exertion progressing to dyspnea at rest without fever, chills, diarrhea, nausea, upper respiratory findings suggestive of bronchitis or pneumonia She was ultimately found to have acute decompensated heart failure Please see below for further details   Hospital Course:  Acute on chronic diastolic heart failure/Cor pulmonale, EF 02/14/14=55% Pa Peak 109!!? Mm/hg D-dimer elevated but VQ scan = low probability for PE, venous Doppler 02/13/14 neg Symptoms improved with IV Lasix, start the patient on Lasix 40 mg a day Recommended by cardiology to cut out the dose as -4.5 L out for hospital stay 2/2 to Cor Pulmonale might need home O2 Desaturation screen is pending  NSVT 02/14/14 [employer] requested to interrogate PPM-Runs of SVT ~ 30 sec ~ 15:10 pm Patient aysmptomatic Magnesium 1.9 on discharge close to 2-no need for replacement further   HTN (hypertension)-continue Imdur30 daily, Lopressor 12.5bid Ace relatively contraindicated 2/2 to Ck-Glt Equation CKD stg 4 Her ACE inhibitor has been completely discontinued this admission.  CKD stage 4 Still producing urine She will need to be kept dry >in fluid overload and it was felt that we would diurese her less aggressively on discharge hence the change in diuretic dosing from 40 mg to 20 mg daily She will need labs in about one week Her cardiologist should see her in 7-10 days after admission to nursing facility  Coronary disease-cardiac enzymes reviewed, they are negative, continue  Lopressor   Aortic insufficiency c severely dilated ascending Aorta- Unclear if a great candidate for surgery She has been told in the past by [name] ~10 years ago that she was not a candidate for ascending aorta replacement Patient ultimately will need goals of care   Anemia-hemoglobin  at baseline of 9.8-10.4   PAF (paroxysmal atrial fibrillation) c Tachybrady syndrome s/p PM 06/2010-rate controlled, continue beta blocker, not on anticoagulation as mentioned in history of present illness  Severe P-EM Loss of weight likely 2/2 Cardiac cacheix Supplement as prn as OP  Procedures:  Echocardiogram-Impressions:  - Normal LV size with moderate LV hypertrophy. EF 55%. Mild to moderate aortic insufficiency. Normal RV size and systolic function. Biatrial enlargement. Moderate tricuspid regurgitation. Severe pulmonary hypertension. Dilated IVC suggestive of elevated RV filling pressure. Severely dilated ascending aorta up to 5.8 cm, not fully evaluated by this echo.  Consultations:  Cardiology, Dr. Debara Pickett  Discharge Exam: Filed Vitals:   02/15/14 0623  BP: 138/49  Pulse: 66  Temp: 97.8 F (36.6 C)  Resp: 18    General: Alert somewhat anxious this morning-states she had a little bit of nasal bleed otherwise doing fair Cardiovascular:  S1-S2 no murmur rub or gallop Respiratory: No lower extremity edema, chest clear  Discharge Instructions   Discharge Instructions    Diet - low sodium heart healthy    Complete by:  As directed      Increase activity slowly    Complete by:  As directed           Current Discharge Medication List    CONTINUE these medications which have CHANGED   Details  furosemide (LASIX) 20 MG tablet Take 1 tablet (20 mg total) by mouth daily. Qty: 45 tablet, Refills: 1   Associated Diagnoses: DOE (dyspnea on exertion)      CONTINUE these medications which have NOT CHANGED   Details  ALPRAZolam (XANAX) 0.5 MG tablet TAKE 1/2 TO 1 TABLET BY MOUTH TWICE DAILY Qty: 60 tablet, Refills: 5    aspirin EC 81 MG tablet Take 81 mg by mouth daily.    B Complex Vitamins (VITAMIN B-COMPLEX PO) Take 1 tablet by mouth daily.    Cholecalciferol (VITAMIN D-3 PO) Take 1 tablet by mouth daily.    Ciprofloxacin-Dexamethasone (CIPRODEX  OT) Place 1 drop into both eyes daily as needed (dry eyes).    ferrous sulfate 325 (65 FE) MG tablet Take 325 mg by mouth daily with breakfast.      HYDROcodone-acetaminophen (NORCO/VICODIN) 5-325 MG per tablet TAKE ONE TABLET BY MOUTH EVERY TWELVE HOURS AS NEEDED FOR PAIN Qty: 120 tablet, Refills: 0    isosorbide mononitrate (IMDUR) 60 MG 24 hr tablet TAKE 1/2 TABLET ONCE DAILY Qty: 45 tablet, Refills: 3    lovastatin (MEVACOR) 20 MG tablet TAKE ONE TABLET BY MOUTH ONCE DAILY Qty: 30 tablet, Refills: 11    metoprolol tartrate (LOPRESSOR) 25 MG tablet TAKE 1/2 TABLET BY MOUTH TWICE DAILY Qty: 90 tablet, Refills: 3    Multiple Vitamin (MULTIVITAMIN) capsule Take 1 capsule by mouth daily.      Multiple Vitamins-Minerals (OCUVITE ADULT 50+ PO) Take 1 capsule by mouth 2 (two) times daily at 10 AM and 5 PM.     Vitamin D, Ergocalciferol, (DRISDOL) 50000 UNITS CAPS TAKE ONE CAPSULE BY MOUTH ONCE A WEEK FOR 12 WEEKS. Qty: 8 capsule, Refills: 0      STOP taking these medications     lisinopril (PRINIVIL,ZESTRIL) 20 MG tablet  amoxicillin (AMOXIL) 500 MG capsule      ciprofloxacin (CIPRO) 250 MG tablet        Allergies  Allergen Reactions  . Sulfa Antibiotics Nausea Only      The results of significant diagnostics from this hospitalization (including imaging, microbiology, ancillary and laboratory) are listed below for reference.    Significant Diagnostic Studies: Dg Chest 2 View  02/12/2014   CLINICAL DATA:  Ascending aortic aneurysm with intermittent shortness of breath and weakness for 1 week. History of hypertension. Initial encounter.  EXAM: CHEST  2 VIEW  COMPARISON:  09/15/2012 radiographs.  Chest CT 05/02/2009.  FINDINGS: The left subclavian pacemaker lead appears unchanged. There is cardiomegaly and diffuse aortic atherosclerosis. Uncorrected for magnification, the ascending aorta measures 8.8 cm on the lateral view, increased from the prior radiographs at which time  it measure approximately 7.5 cm. There is chronic lung disease with pleural parenchymal scarring and superimposed bibasilar atelectasis. There are new small bilateral pleural effusions. Multiple thoracic compression deformities are grossly stable status post 2 level augmentation  IMPRESSION: 1. New bilateral pleural effusions with associated bibasilar atelectasis. 2. Suspected progressive enlargement of the ascending aortic aneurysm compared with prior radiographs and CT. Follow up Chest CTA recommended.   Electronically Signed   By: Camie Patience M.D.   On: 02/12/2014 14:08   Nm Pulmonary Perf And Vent  02/13/2014   CLINICAL DATA:  Shortness of breath  EXAM: NUCLEAR MEDICINE VENTILATION - PERFUSION LUNG SCAN  TECHNIQUE: Ventilation images were obtained in multiple projections using inhaled aerosol technetium 99 M DTPA. Perfusion images were obtained in multiple projections after intravenous injection of Tc-81m MAA.  RADIOPHARMACEUTICALS:  49.5 mCi Tc-84m DTPA aerosol and 5.5 mCi Tc-42m MAA  COMPARISON:  Chest x-ray from 1 day prior  FINDINGS: There is no definite segmental perfusion defect when accounting for pleural effusions and artifact from pacer battery pack in the left chest. There are patchy counts on oblique imaging, but no clear peripheral defect unexplained by artifact or mediastinal widening (there is cardiomegaly and a large ascending aortic aneurysm on comparison radiography). Aerosolized tracer deposition is heterogeneous consistent with airways disease.  IMPRESSION: Low probability for pulmonary embolism.   Electronically Signed   By: Jorje Guild M.D.   On: 02/13/2014 09:10    Microbiology: No results found for this or any previous visit (from the past 240 hour(s)).   Labs: Basic Metabolic Panel:  Recent Labs Lab 02/12/14 1234 02/13/14 0540 02/15/14 0513  NA 138 141 140  K 4.4 4.5 4.0  CL 102 99 96  CO2 29 34* 33*  GLUCOSE 95 104* 119*  BUN 42* 39* 53*  CREATININE 0.83 0.99  0.91  CALCIUM 8.7 9.0 9.0  MG  --   --  1.9   Liver Function Tests:  Recent Labs Lab 02/12/14 1234  AST 20  ALT 12  ALKPHOS 67  BILITOT 0.7  PROT 6.4  ALBUMIN 3.3*   No results for input(s): LIPASE, AMYLASE in the last 168 hours. No results for input(s): AMMONIA in the last 168 hours. CBC:  Recent Labs Lab 02/12/14 1234 02/14/14 0555 02/15/14 0513  WBC 5.3 5.0 5.1  NEUTROABS 4.1  --  3.9  HGB 9.3* 9.7* 9.7*  HCT 30.1* 31.3* 31.8*  MCV 90.7 90.7 91.1  PLT 221 253 254   Cardiac Enzymes:  Recent Labs Lab 02/12/14 1759 02/12/14 2339 02/13/14 0540  TROPONINI <0.03 <0.03 <0.03   BNP: BNP (last 3 results)  Recent Labs  02/12/14 1234  BNP 838.4*    ProBNP (last 3 results) No results for input(s): PROBNP in the last 8760 hours.  CBG: No results for input(s): GLUCAP in the last 168 hours.     SignedNita Sells  [facility] 02/15/2014, 8:54 AM

## 2014-02-15 NOTE — Progress Notes (Signed)
Physical Therapy Treatment Patient Details Name: Felicia [name] MRN: [id number] DOB: Jul 20, 1912 Today's Date: 02/15/2014    History of Present Illness 81 year old female was admitted for CHF exacerbation.  She has a h/o A-Fib, HTN, pacemaker    PT Comments    Patient reports feeling weak today. Did ambulate,  Desaturation on RA. 100% w and drops to 85 after walking 120' on RA.   Follow Up Recommendations  SNF     Equipment Recommendations       Recommendations for Other Services       Precautions / Restrictions Precautions Precautions: Fall Precaution Comments: monitor sats, may need O2 Restrictions Weight Bearing Restrictions: No    Mobility  Bed Mobility                  Transfers   Equipment used: Rolling walker (2 wheeled) Transfers: Sit to/from Stand Sit to Stand: Min assist            Ambulation/Gait Ambulation/Gait assistance: Min assist Ambulation Distance (Feet): 120 Feet Assistive device: Rolling walker (2 wheeled) Gait Pattern/deviations: Step-through pattern;Decreased stride length     General Gait Details: close guarding, slow pace   Financial trader [employer] (Stroke Patients Only)       Balance                                    Cognition Arousal/Alertness: Awake/alert                          Exercises      General Comments        Pertinent Vitals/Pain Faces Pain Scale: Hurts little more Pain Location: back Pain Descriptors / Indicators: Aching Pain Intervention(s): Monitored during session    Home Living                      Prior Function            PT Goals (current goals can now be found in the care plan section) Progress towards PT goals: Progressing toward goals    Frequency  Min 3X/week    PT Plan Current plan remains appropriate    Co-evaluation             End of Session   Activity Tolerance: Patient limited by  fatigue Patient left: in chair;with call bell/phone within reach;with chair alarm set     Time: [id number] PT Time Calculation (min) (ACUTE ONLY): 19 min  Charges:  $Gait Training: 8-22 mins                    G Codes:      Claretha Cooper 02/15/2014, 9:35 AM Tresa Endo PT [phone]

## 2014-02-15 NOTE — Progress Notes (Signed)
DAILY PROGRESS NOTE  Subjective:  Net negative another 1L.  Breathing is improved. Creatinine is stable. Breathing is improved. Echo yesterday shows preserved systolic function and EF 56%, there has been interval enlargement of her thoracic aneurysm to >5.8 cm.  There is also reportedly severe pulmonary hypertension.  Objective:  Temp:  [97.6 F (36.4 C)-98.2 F (36.8 C)] 97.8 F (36.6 C) (02/04 0623) Pulse Rate:  [54-66] 66 (02/04 0623) Resp:  [16-18] 18 (02/04 0623) BP: (117-143)/(40-49) 138/49 mmHg (02/04 0623) SpO2:  [92 %-96 %] 92 % (02/04 0623) Weight:  [94 lb 12.8 oz (43 kg)] 94 lb 12.8 oz (43 kg) (02/04 0623) Weight change: -0 oz (-0.001 kg)  Intake/Output from previous day: 02/03 0701 - 02/04 0700 In: 720 [P.O.:720] Out: 1900 [Urine:1900]  Intake/Output from this shift: Total I/O In: -  Out: 1000 [Urine:1000]  Medications: Current Facility-Administered Medications  Medication Dose Route Frequency Provider Last Rate Last Dose  . ALPRAZolam Duanne Moron) tablet 0.25 mg  0.25 mg Oral TID PRN Reyne Dumas, MD   0.25 mg at 02/12/14 2345  . aspirin EC tablet 81 mg  81 mg Oral Daily Reyne Dumas, MD   81 mg at 02/14/14 1031  . enoxaparin (LOVENOX) injection 30 mg  30 mg Subcutaneous Q24H Luiz Ochoa, RPH   30 mg at 02/14/14 2129  . furosemide (LASIX) tablet 40 mg  40 mg Oral Daily Reyne Dumas, MD   40 mg at 02/14/14 1031  . guaiFENesin (MUCINEX) 12 hr tablet 600 mg  600 mg Oral BID Gardiner Barefoot, NP   600 mg at 02/14/14 1031  . HYDROcodone-acetaminophen (NORCO/VICODIN) 5-325 MG per tablet 1 tablet  1 tablet Oral Q6H PRN Reyne Dumas, MD   1 tablet at 02/13/14 0320  . isosorbide mononitrate (IMDUR) 24 hr tablet 30 mg  30 mg Oral Daily Reyne Dumas, MD   30 mg at 02/14/14 1031  . lactose free nutrition (BOOST PLUS) liquid 237 mL  237 mL Oral BID BM Dorann Ou, RD   237 mL at 02/14/14 1322  . levalbuterol (XOPENEX) nebulizer solution 0.63 mg  0.63 mg  Nebulization Q4H PRN Reyne Dumas, MD      . metoprolol tartrate (LOPRESSOR) tablet 12.5 mg  12.5 mg Oral BID Reyne Dumas, MD   12.5 mg at 02/14/14 2129  . pravastatin (PRAVACHOL) tablet 10 mg  10 mg Oral q1800 Reyne Dumas, MD   10 mg at 02/14/14 1733  . traZODone (DESYREL) tablet 25 mg  25 mg Oral QHS PRN Reyne Dumas, MD   25 mg at 02/14/14 2130    Physical Exam: General appearance: alert and no distress Neck: no carotid bruit and no JVD Lungs: clear to auscultation bilaterally Heart: regular rate and rhythm Extremities: extremities normal, atraumatic, no cyanosis or edema  Lab Results: Results for orders placed or performed during the hospital encounter of 02/12/14 (from the past 48 hour(s))  CBC     Status: Abnormal   Collection Time: 02/14/14  5:55 AM  Result Value Ref Range   WBC 5.0 4.0 - 10.5 K/uL   RBC 3.45 (L) 3.87 - 5.11 MIL/uL   Hemoglobin 9.7 (L) 12.0 - 15.0 g/dL   HCT 31.3 (L) 36.0 - 46.0 %   MCV 90.7 78.0 - 100.0 fL   MCH 28.1 26.0 - 34.0 pg   MCHC 31.0 30.0 - 36.0 g/dL   RDW 14.2 11.5 - 15.5 %   Platelets 253 150 - 400 K/uL  Basic  metabolic panel     Status: Abnormal   Collection Time: 02/15/14  5:13 AM  Result Value Ref Range   Sodium 140 135 - 145 mmol/L   Potassium 4.0 3.5 - 5.1 mmol/L   Chloride 96 96 - 112 mmol/L   CO2 33 (H) 19 - 32 mmol/L   Glucose, Bld 119 (H) 70 - 99 mg/dL   BUN 53 (H) 6 - 23 mg/dL   Creatinine, Ser 0.91 0.50 - 1.10 mg/dL   Calcium 9.0 8.4 - 10.5 mg/dL   GFR calc non Af Amer 50 (L) >90 mL/min   GFR calc Af Amer 58 (L) >90 mL/min    Comment: (NOTE) The eGFR has been calculated using the CKD EPI equation. This calculation has not been validated in all clinical situations. eGFR's persistently <90 mL/min signify possible Chronic Kidney Disease.    Anion gap 11 5 - 15  CBC with Differential/Platelet     Status: Abnormal   Collection Time: 02/15/14  5:13 AM  Result Value Ref Range   WBC 5.1 4.0 - 10.5 K/uL   RBC 3.49 (L) 3.87 -  5.11 MIL/uL   Hemoglobin 9.7 (L) 12.0 - 15.0 g/dL   HCT 31.8 (L) 36.0 - 46.0 %   MCV 91.1 78.0 - 100.0 fL   MCH 27.8 26.0 - 34.0 pg   MCHC 30.5 30.0 - 36.0 g/dL   RDW 14.1 11.5 - 15.5 %   Platelets 254 150 - 400 K/uL   Neutrophils Relative % 77 43 - 77 %   Neutro Abs 3.9 1.7 - 7.7 K/uL   Lymphocytes Relative 13 12 - 46 %   Lymphs Abs 0.7 0.7 - 4.0 K/uL   Monocytes Relative 7 3 - 12 %   Monocytes Absolute 0.4 0.1 - 1.0 K/uL   Eosinophils Relative 3 0 - 5 %   Eosinophils Absolute 0.1 0.0 - 0.7 K/uL   Basophils Relative 0 0 - 1 %   Basophils Absolute 0.0 0.0 - 0.1 K/uL  Magnesium     Status: None   Collection Time: 02/15/14  5:13 AM  Result Value Ref Range   Magnesium 1.9 1.5 - 2.5 mg/dL    Imaging: Nm Pulmonary Perf And Vent  02/13/2014   CLINICAL DATA:  Shortness of breath  EXAM: NUCLEAR MEDICINE VENTILATION - PERFUSION LUNG SCAN  TECHNIQUE: Ventilation images were obtained in multiple projections using inhaled aerosol technetium 99 M DTPA. Perfusion images were obtained in multiple projections after intravenous injection of Tc-40m MAA.  RADIOPHARMACEUTICALS:  49.5 mCi Tc-12m DTPA aerosol and 5.5 mCi Tc-95m MAA  COMPARISON:  Chest x-ray from 1 day prior  FINDINGS: There is no definite segmental perfusion defect when accounting for pleural effusions and artifact from pacer battery pack in the left chest. There are patchy counts on oblique imaging, but no clear peripheral defect unexplained by artifact or mediastinal widening (there is cardiomegaly and a large ascending aortic aneurysm on comparison radiography). Aerosolized tracer deposition is heterogeneous consistent with airways disease.  IMPRESSION: Low probability for pulmonary embolism.   Electronically Signed   By: Jorje Guild M.D.   On: 02/13/2014 09:10    Assessment:  Active Problems:   HTN (hypertension)   Coronary disease   Aortic insufficiency   Anemia   Cardiac pacemaker   PAF (paroxysmal atrial fibrillation)    Dyspnea on exertion   CHF exacerbation   Protein-calorie malnutrition, severe   Plan:  1. Echo shows interval enlargement of her thoracic aortic aneurysm. She  understands this is at higher risk of rupture which could be fatal, but she is not a surgical candidate given her age and co-morbidities. This has been previously addressed by her cardiologist. There is a report of severe pulmonary hypertension. Would be helpful to check ambulatory saturations - she may require home oxygen.  No further treatments would be suggested. Would continue current dose diuretic for now, but may need to decrease to 20 mg daily at discharge as she remains net negative about 1L daily.  I'm not concerned about her SVT - she is asymptomatic with this. She tells me tentative plan for SNF. Ready to go from a cardiac standpoint.  No further cardiac suggestions at this time. Follow-up with [name]. Will sign-off.   Time Spent Directly with Patient:  15 minutes  Length of Stay:  LOS: 3 days   Pixie Casino, MD, [facility] Attending Cardiologist [facility]  HILTY,Kenneth C 02/15/2014, 6:58 AM

## 2014-03-02 ENCOUNTER — Encounter: Payer: Self-pay | Admitting: Cardiology

## 2014-03-02 ENCOUNTER — Ambulatory Visit (INDEPENDENT_AMBULATORY_CARE_PROVIDER_SITE_OTHER): Payer: Medicare Other | Admitting: Cardiology

## 2014-03-02 VITALS — BP 114/60 | HR 74 | Ht 66.0 in | Wt 92.2 lb

## 2014-03-02 DIAGNOSIS — I447 Left bundle-branch block, unspecified: Secondary | ICD-10-CM

## 2014-03-02 DIAGNOSIS — I351 Nonrheumatic aortic (valve) insufficiency: Secondary | ICD-10-CM

## 2014-03-02 DIAGNOSIS — I480 Paroxysmal atrial fibrillation: Secondary | ICD-10-CM | POA: Diagnosis not present

## 2014-03-02 LAB — BASIC METABOLIC PANEL
BUN: 36 mg/dL — AB (ref 6–23)
CALCIUM: 9.3 mg/dL (ref 8.4–10.5)
CO2: 46 meq/L — AB (ref 19–32)
Chloride: 92 mEq/L — ABNORMAL LOW (ref 96–112)
Creatinine, Ser: 0.84 mg/dL (ref 0.40–1.20)
GFR: 66.3 mL/min (ref >60.00)
Glucose, Bld: 98 mg/dL (ref 70–99)
Potassium: 4.1 mEq/L (ref 3.5–5.1)
Sodium: 140 mEq/L (ref 135–145)

## 2014-03-02 NOTE — Progress Notes (Signed)
Quick Note:  Please report to patient. The recent labs are stable. Continue same medication and careful diet. ______ 

## 2014-03-02 NOTE — Patient Instructions (Signed)
Will obtain labs today and call you with the results (BMET)  Your physician recommends that you continue on your current medications as directed. Please refer to the Current Medication list given to you today.  Your physician recommends that you schedule a follow-up appointment in: [place]

## 2014-03-02 NOTE — Progress Notes (Signed)
Cardiology Office Note   Date:  03/02/2014   ID:  Felicia [name], DOB Jun 08, 1912, MRN [id number]  PCP:  [name], MD  Cardiologist:   Darlin Coco, MD   No chief complaint on file.     History of Present Illness: Felicia [name] is a 81 y.o. female who presents for post hospital office visit This pleasant 81 year old woman is seen for a post hospital office visit.  She was recently admitted with exacerbation of CHF.  He responded to high-dose diuretics.  She has a remote history of paroxysmal atrial fibrillation. She does have an underlying pacemaker which was implanted for bradycardia on 06/23/10 at [facility]. She has not been experiencing any chest pain or angina. She does have a history of ischemic heart disease and had prior angioplasty to the LAD in 1994. She has a known ascending aortic aneurysm and a known murmur of aortic insufficiency. Because of her age and comorbidities, these problems are being managed medically. At her last visit she showed evidence of volume overload with peripheral edema and increasing exertional dyspnea. We started her on furosemide 10 mg daily.Since coming out of the hospital, she is now on furosemide 20 mg daily.  She is not having any recurrence of peripheral edema.  He is presently residing in a skilled nursing facility and she is getting physical therapy and occupational therapy and speech therapy.  Her weight is down 8 pounds since her last office visit here.  She is looking forward to getting back to her own home.  She is now on oxygen at 2 L a minute since her hospitalization.  With activity, without oxygen her oxygen levels dropped into the mid 80s.  She was hospitalized from 02/11/14 until 02/15/14  Past Medical History  Diagnosis Date  . Hypertension   . Hyperlipidemia   . Arthritis   . Macular degeneration   . Aortic insufficiency   . Vitamin D deficiency   . Coronary artery disease   . LBBB (left bundle branch block)   .  Ascending aortic aneurysm   . Hip fracture   . Paroxysmal atrial fibrillation   . Tachycardia-bradycardia     s/pPPM    Past Surgical History  Procedure Laterality Date  . Appendectomy    . Abdominal hysterectomy    . Fracture surgery      left hip  . Eye surgery      cataracts  . Coronary artery bypass graft  11/11/1992    EF 70%  . Transthoracic echocardiogram  08/18/2009    EF 60-65%  . US echocardiography  07/22/2009    EF 55-60%  . Transthoracic echocardiogram  05/02/2009    EF 45-50%  . US echocardiography  07/18/2008    EF 55-60%  . US echocardiography  07/14/2007    EF  55-60%  . US echocardiography  06/09/2006    EF 55-60%  . Cardiovascular stress test  08/21/2002    EF 65%, NO ISCHEMIA  . Pacemaker insertion  06/2010    [place] 60 VVI pacemaker implanted by Dr Willis Modena at [facility] for tachy/brady syndrome     Current Outpatient Prescriptions  Medication Sig Dispense Refill  . acetaminophen (TYLENOL) 325 MG tablet Take 650 mg by mouth every 6 (six) hours as needed for moderate pain (take two tablets prn).    Marland Kitchen aspirin EC 81 MG tablet Take 81 mg by mouth daily.    . B Complex Vitamins (VITAMIN B-COMPLEX PO) Take 1 tablet by  mouth daily.    . Cholecalciferol (VITAMIN D-3 PO) Take 1 tablet by mouth daily.    . Ciprofloxacin-Dexamethasone (CIPRODEX OT) Place 1 drop into the right ear daily.     . ferrous sulfate 325 (65 FE) MG tablet Take 325 mg by mouth daily with breakfast.      . furosemide (LASIX) 20 MG tablet Take 1 tablet (20 mg total) by mouth daily. 45 tablet 1  . HYDROcodone-acetaminophen (NORCO/VICODIN) 5-325 MG per tablet Take 1 tablet by mouth every 6 (six) hours as needed for severe pain. 30 tablet 0  . isosorbide mononitrate (IMDUR) 60 MG 24 hr tablet TAKE 1/2 TABLET ONCE DAILY 45 tablet 3  . LORazepam (ATIVAN) 0.5 MG tablet Take 0.25 mg by mouth 2 (two) times daily.    Marland Kitchen lovastatin (MEVACOR) 20 MG tablet TAKE ONE TABLET BY MOUTH ONCE DAILY 30 tablet 11    . metoprolol tartrate (LOPRESSOR) 25 MG tablet TAKE 1/2 TABLET BY MOUTH TWICE DAILY 90 tablet 3  . Multiple Vitamin (MULTIVITAMIN) capsule Take 1 capsule by mouth daily.      . Multiple Vitamins-Minerals (OCUVITE ADULT 50+ PO) Take 1 capsule by mouth 2 (two) times daily at 10 AM and 5 PM.     . traZODone (DESYREL) 100 MG tablet Take 100 mg by mouth as needed for sleep.    . Vitamin D, Ergocalciferol, (DRISDOL) 50000 UNITS CAPS TAKE ONE CAPSULE BY MOUTH ONCE A WEEK FOR 12 WEEKS. 8 capsule 0   No current facility-administered medications for this visit.    Allergies:   Sulfa antibiotics    Social History:  The patient  reports that she has never smoked. She has never used smokeless tobacco. She reports that she does not drink alcohol.   Family History:  The patient's family history includes Diabetes in her son; Heart attack in her father and another family member.    ROS:  Please see the history of present illness.   Otherwise, review of systems are positive for none.   All other systems are reviewed and negative.    PHYSICAL EXAM: VS:  BP 114/60 mmHg  Pulse 74  Ht 5\' 6"  (1.676 m)  Wt 92 lb 3.2 oz (41.822 kg)  BMI 14.89 kg/m2 , BMI Body mass index is 14.89 kg/(m^2). GEN: Well nourished, well developed, in no acute distress HEENT: normal Neck: no JVD, carotid bruits, or masses Cardiac: RRR; there is a grade 2/6 decrescendo murmur of aortic insufficiency at the left sternal edge.  No rubs, or gallops,no edema  Respiratory:  clear to auscultation bilaterally, normal work of breathing GI: soft, nontender, nondistended, + BS MS: no deformity or atrophy Skin: warm and dry, no rash Neuro:  Strength and sensation are intact Psych: euthymic mood, full affect   EKG:  EKG is not ordered today.    Recent Labs: 02/12/2014: ALT 12; B Natriuretic Peptide 838.4*; TSH 1.587 02/15/2014: BUN 53*; Creatinine 0.91; Hemoglobin 9.7*; Magnesium 1.9; Platelets 254; Potassium 4.0; Sodium 140     Lipid Panel    Component Value Date/Time   CHOL 145 12/28/2013 1101   TRIG 68 12/28/2013 1101   HDL 66 12/28/2013 1101   CHOLHDL 2.2 12/28/2013 1101   VLDL 14 12/28/2013 1101   LDLCALC 65 12/28/2013 1101      Wt Readings from Last 3 Encounters:  03/02/14 92 lb 3.2 oz (41.822 kg)  02/15/14 94 lb 12.8 oz (43 kg)  12/28/13 100 lb (45.36 kg)  ASSESSMENT AND PLAN:  1. aneurysm of the ascending aorta with known murmur of aortic insufficiency  2. tachybradycardia syndrome with functioning pacemaker implanted 06/23/10 at [facility].  3. chronic diastolic heart failure with increased shortness of breath and peripheral edema.   4. left bundle-branch block  5. ischemic heart disease status post angioplasty of LAD in 1994  6. frequent urinary tract infections  7. osteoarthritis and low back pain 8.  Old age and frailty   Current medicines are reviewed at length with the patient today.  The patient does not have concerns regarding medicines.  The following changes have been made:  no change  Labs/ tests ordered today include: Basal metabolic panel   Orders Placed This Encounter  Procedures  . Basic metabolic panel     Disposition:   FU with [name] in 4 months for office visit and EKG and basal metabolic panel.   Signed, Darlin Coco, MD  03/02/2014 1:31 PM    [facility] [place], [place], [place]  [address] Phone: [phone]; Fax: [phone]

## 2014-04-06 DIAGNOSIS — I5040 Unspecified combined systolic (congestive) and diastolic (congestive) heart failure: Secondary | ICD-10-CM

## 2014-04-06 DIAGNOSIS — E785 Hyperlipidemia, unspecified: Secondary | ICD-10-CM

## 2014-04-06 DIAGNOSIS — I10 Essential (primary) hypertension: Secondary | ICD-10-CM

## 2014-04-06 DIAGNOSIS — N390 Urinary tract infection, site not specified: Secondary | ICD-10-CM

## 2014-05-16 ENCOUNTER — Other Ambulatory Visit: Payer: Self-pay | Admitting: Internal Medicine

## 2014-07-02 ENCOUNTER — Other Ambulatory Visit: Payer: Self-pay | Admitting: Internal Medicine

## 2014-07-04 ENCOUNTER — Encounter: Payer: Self-pay | Admitting: Cardiology

## 2014-07-04 ENCOUNTER — Ambulatory Visit (INDEPENDENT_AMBULATORY_CARE_PROVIDER_SITE_OTHER): Payer: Medicare Other | Admitting: Cardiology

## 2014-07-04 VITALS — BP 138/80 | HR 58 | Ht 66.0 in | Wt 97.0 lb

## 2014-07-04 DIAGNOSIS — R0609 Other forms of dyspnea: Secondary | ICD-10-CM | POA: Diagnosis not present

## 2014-07-04 DIAGNOSIS — I351 Nonrheumatic aortic (valve) insufficiency: Secondary | ICD-10-CM | POA: Diagnosis not present

## 2014-07-04 DIAGNOSIS — I480 Paroxysmal atrial fibrillation: Secondary | ICD-10-CM

## 2014-07-04 DIAGNOSIS — I447 Left bundle-branch block, unspecified: Secondary | ICD-10-CM

## 2014-07-04 NOTE — Patient Instructions (Signed)
Medication Instructions:  Your physician recommends that you continue on your current medications as directed. Please refer to the Current Medication list given to you today.  Labwork: NONE  Testing/Procedures: NONE  Follow-Up: Your physician wants you to follow-up in: [place] will receive a reminder letter in the mail two months in advance. If you don't receive a letter, please call our office to schedule the follow-up appointment.

## 2014-07-04 NOTE — Progress Notes (Signed)
Cardiology Office Note   Date:  07/04/2014   ID:  Felicia [name], DOB [birth year], MRN [id number]  PCP:  Felicia Collie, MD  Cardiologist: Darlin Coco MD  No chief complaint on file.     History of Present Illness: Felicia [name] is a 81 y.o. female who presents for follow-up office visit  She has a history of diastolic heart failure.  She was admitted in February 2016.  An echocardiogram on 02/14/14 showed an ejection fraction 55%.  There was mild to moderate aortic insufficiency.  She has a dilated ascending aorta of 5.8 cm. She has a remote history of paroxysmal atrial fibrillation. She does have an underlying pacemaker which was implanted for bradycardia on 06/23/10 at [facility]. She has not been experiencing any chest pain or angina. She does have a history of ischemic heart disease and had prior angioplasty to the LAD in 1994. She has a known ascending aortic aneurysm and a known murmur of aortic insufficiency. Because of her age and comorbidities, these problems are being managed medically. At her last visit she showed evidence of volume overload with peripheral edema and increasing exertional dyspnea. We started her on furosemide 10 mg daily.Since coming out of the hospital, she is now on furosemide 20 mg daily. She is now living at home.  Her son Felicia [name] who accompanied her today stays with her at night.  During the day she has help.  With activity, without oxygen her oxygen levels dropped into the mid 80s. She was hospitalized from 02/11/14 until 02/15/14.  She is on home oxygen at 2 L a minute. She has occasional chest pain and uses sublingual nitroglycerin occasionally. She turned 81 years old in May and they had a big party for her at her church.  Past Medical History  Diagnosis Date  . Hypertension   . Hyperlipidemia   . Arthritis   . Macular degeneration   . Aortic insufficiency   . Vitamin D deficiency   . Coronary artery disease   . LBBB (left bundle branch  block)   . Ascending aortic aneurysm   . Hip fracture   . Paroxysmal atrial fibrillation   . Tachycardia-bradycardia     s/pPPM    Past Surgical History  Procedure Laterality Date  . Appendectomy    . Abdominal hysterectomy    . Fracture surgery      left hip  . Eye surgery      cataracts  . Coronary artery bypass graft  11/11/1992    EF 70%  . Transthoracic echocardiogram  08/18/2009    EF 60-65%  . US echocardiography  07/22/2009    EF 55-60%  . Transthoracic echocardiogram  05/02/2009    EF 45-50%  . US echocardiography  07/18/2008    EF 55-60%  . US echocardiography  07/14/2007    EF  55-60%  . US echocardiography  06/09/2006    EF 55-60%  . Cardiovascular stress test  08/21/2002    EF 65%, NO ISCHEMIA  . Pacemaker insertion  06/2010    [place] 60 VVI pacemaker implanted by Dr Willis Modena at [facility] for tachy/brady syndrome     Current Outpatient Prescriptions  Medication Sig Dispense Refill  . acetaminophen (TYLENOL) 325 MG tablet Take 650 mg by mouth every 6 (six) hours as needed for moderate pain (take two tablets prn).    Marland Kitchen aspirin EC 81 MG tablet Take 81 mg by mouth daily.    . B Complex Vitamins (VITAMIN B-COMPLEX PO)  Take 1 tablet by mouth daily.    . Cholecalciferol (VITAMIN D-3 PO) Take 1 tablet by mouth daily.    . Ciprofloxacin-Dexamethasone (CIPRODEX OT) Place 1 drop into the right ear daily.     . ferrous sulfate 325 (65 FE) MG tablet Take 325 mg by mouth daily with breakfast.      . furosemide (LASIX) 20 MG tablet Take 1 tablet (20 mg total) by mouth daily. 45 tablet 1  . isosorbide mononitrate (IMDUR) 60 MG 24 hr tablet Take 30 mg by mouth daily.    Marland Kitchen LORazepam (ATIVAN) 0.5 MG tablet Take 0.25 mg by mouth 2 (two) times daily.    Marland Kitchen lovastatin (MEVACOR) 20 MG tablet TAKE ONE TABLET BY MOUTH EVERY EVENING FOR CHOLESTEROL. 30 tablet 11  . metoprolol tartrate (LOPRESSOR) 25 MG tablet TAKE 1/2 TABLET BY MOUTH TWICE DAILY 90 tablet 3  . Multiple Vitamin  (MULTIVITAMIN) capsule Take 1 capsule by mouth daily.      . Multiple Vitamins-Minerals (OCUVITE ADULT 50+ PO) Take 1 capsule by mouth 2 (two) times daily at 10 AM and 5 PM.     . nitroGLYCERIN (NITROSTAT) 0.4 MG SL tablet Place 0.4 mg under the tongue as needed. Up to 3 doses for chest pain    . traZODone (DESYREL) 100 MG tablet Take 100 mg by mouth as needed for sleep.    . Vitamin D, Ergocalciferol, (DRISDOL) 50000 UNITS CAPS TAKE ONE CAPSULE BY MOUTH ONCE A WEEK FOR 12 WEEKS. 8 capsule 0   No current facility-administered medications for this visit.    Allergies:   Sulfa antibiotics    Social History:  The patient  reports that she has never smoked. She has never used smokeless tobacco. She reports that she does not drink alcohol.   Family History:  The patient's family history includes Diabetes in her son; Heart attack in her father and another family member.    ROS:  Please see the history of present illness.   Otherwise, review of systems are positive for none.   All other systems are reviewed and negative.    PHYSICAL EXAM: VS:  BP 138/80 mmHg  Pulse 58  Ht 5\' 6"  (1.676 m)  Wt 97 lb (43.999 kg)  BMI 15.66 kg/m2 , BMI Body mass index is 15.66 kg/(m^2). GEN: Well nourished, well developed, in no acute distress HEENT: normal Neck: no JVD, carotid bruits, or masses Cardiac: RRR; grade 2/6 decrescendo murmur of aortic insufficiency at left sternal edge Respiratory:  clear to auscultation bilaterally, normal work of breathing GI: soft, nontender, nondistended, + BS MS: no deformity or atrophy.  There is kyphoscoliosis Skin: warm and dry, no rash Neuro:  Strength and sensation are intact Psych: euthymic mood, full affect   EKG:  EKG is ordered today. The ekg ordered today demonstrates normal sinus rhythm at 58 bpm.  Left axis deviation and left bundle-branch block.  Since previous tracing of 02/12/14, no significant change   Recent Labs: 02/12/2014: ALT 12; B Natriuretic  Peptide 838.4*; TSH 1.587 02/15/2014: Hemoglobin 9.7*; Magnesium 1.9; Platelets 254 03/02/2014: BUN 36*; Creatinine, Ser 0.84; Potassium 4.1; Sodium 140    Lipid Panel    Component Value Date/Time   CHOL 145 12/28/2013 1101   TRIG 68 12/28/2013 1101   HDL 66 12/28/2013 1101   CHOLHDL 2.2 12/28/2013 1101   VLDL 14 12/28/2013 1101   LDLCALC 65 12/28/2013 1101      Wt Readings from Last 3 Encounters:  07/04/14 97 lb (  43.999 kg)  03/02/14 92 lb 3.2 oz (41.822 kg)  02/15/14 94 lb 12.8 oz (43 kg)      *   ASSESSMENT AND PLAN:  1. aneurysm of the ascending aorta with known murmur of aortic insufficiency  2. tachybradycardia syndrome with functioning pacemaker implanted 06/23/10 at [facility].  3. chronic diastolic heart failure with increased shortness of breath and peripheral edema.  Good response to low-dose furosemide 20 mg daily  4. left bundle-branch block  5. ischemic heart disease status post angioplasty of LAD in 1994.  Infrequent chest pain relieved by nitroglycerin  6. frequent urinary tract infections  7. osteoarthritis and low back pain 8. Old age and frailty   Current medicines are reviewed at length with the patient today.  The patient does not have concerns regarding medicines.  The following changes have been made:  no change  Labs/ tests ordered today include:   Orders Placed This Encounter  Procedures  . EKG 12-Lead     His physician: Continue current medication.  Recheck in 6 months for office visit. Her new PCP is Dr. Marco [name] in [place].  Berna Spare MD 07/04/2014 12:55 PM    [address], [place], [place]  [address] Phone: [phone]; Fax: [phone]

## 2014-10-22 ENCOUNTER — Encounter: Payer: Self-pay | Admitting: Internal Medicine

## 2014-10-22 ENCOUNTER — Ambulatory Visit (INDEPENDENT_AMBULATORY_CARE_PROVIDER_SITE_OTHER): Payer: Medicare Other | Admitting: Internal Medicine

## 2014-10-22 VITALS — BP 132/90 | HR 55 | Ht 64.0 in | Wt 98.4 lb

## 2014-10-22 DIAGNOSIS — I495 Sick sinus syndrome: Secondary | ICD-10-CM

## 2014-10-22 DIAGNOSIS — Z950 Presence of cardiac pacemaker: Secondary | ICD-10-CM | POA: Diagnosis not present

## 2014-10-22 LAB — CUP PACEART INCLINIC DEVICE CHECK
Brady Statistic RV Percent Paced: 0 %
Lead Channel Impedance Value: 530 Ohm
Lead Channel Pacing Threshold Amplitude: 0.7 V
Lead Channel Pacing Threshold Pulse Width: 0.5 ms
Lead Channel Sensing Intrinsic Amplitude: 12 mV
Lead Channel Setting Pacing Pulse Width: 0.5 ms
Lead Channel Setting Sensing Sensitivity: 2.5 mV
MDC IDC PG SERIAL: 166745
MDC IDC SESS DTM: 20161010040000
MDC IDC SET LEADCHNL RV PACING AMPLITUDE: 2.4 V

## 2014-10-22 NOTE — Progress Notes (Signed)
HODGES,BETH, MD: Primary Cardiologist:  Dr Lequita Halt is a 81 y.o. 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  The patient reports doing very well since her last visit.  She was hospitalized earlier this year with CHF and has been since on oxygen.  She is doing well presently.  SOB is stable.  She has poor vision and hearing.  Today, she  denies symptoms of palpitations, chest pain, orthopnea, PND, lower extremity edema, presyncope, syncope, or neurologic sequela.  The patientis tolerating medications without difficulties and is otherwise without complaint today.   Past Medical History  Diagnosis Date  . Hypertension   . Hyperlipidemia   . Arthritis   . Macular degeneration   . Aortic insufficiency   . Vitamin D deficiency   . Coronary artery disease   . LBBB (left bundle branch block)   . Ascending aortic aneurysm ([place])   . Hip fracture ([place])   . Paroxysmal atrial fibrillation ([facility])   . Tachycardia-bradycardia [facility])     s/pPPM   Past Surgical History  Procedure Laterality Date  . Appendectomy    . Abdominal hysterectomy    . Fracture surgery      left hip  . Eye surgery      cataracts  . Coronary artery bypass graft  11/11/1992    EF 70%  . Transthoracic echocardiogram  08/18/2009    EF 60-65%  . US echocardiography  07/22/2009    EF 55-60%  . Transthoracic echocardiogram  05/02/2009    EF 45-50%  . US echocardiography  07/18/2008    EF 55-60%  . US echocardiography  07/14/2007    EF  55-60%  . US echocardiography  06/09/2006    EF 55-60%  . Cardiovascular stress test  08/21/2002    EF 65%, NO ISCHEMIA  . Pacemaker insertion  06/2010    [place] 60 VVI pacemaker implanted by Dr Willis Modena at [facility] for tachy/brady syndrome    Social History   Social History  . Marital Status: Widowed    Spouse Name: N/A  . Number of Children: N/A    . Years of Education: N/A   Occupational History  . Not on file.   Social History Main Topics  . Smoking status: Never Smoker   . Smokeless tobacco: Never Used  . Alcohol Use: No  . Drug Use: Not on file  . Sexual Activity: Not on file   Other Topics Concern  . Not on file   Social History Narrative    Family History  Problem Relation Age of Onset  . Heart attack    . Heart attack Father   . Diabetes Son     Allergies  Allergen Reactions  . Sulfa Antibiotics Nausea Only    Current Outpatient Prescriptions  Medication Sig Dispense Refill  . acetaminophen (TYLENOL) 325 MG tablet Take 650 mg by mouth every 6 (six) hours as needed for moderate pain (take two tablets prn).    Marland Kitchen aspirin EC 81 MG tablet Take 81 mg by mouth daily.    . B Complex Vitamins (VITAMIN B-COMPLEX PO) Take 1 tablet by mouth daily.    . Cholecalciferol (VITAMIN D-3 PO) Take 1 tablet by mouth daily.    . Ciprofloxacin-Dexamethasone (CIPRODEX OT) Place 1 drop into the right ear daily.     . ferrous sulfate 325 (65 FE) MG tablet Take  325 mg by mouth daily with breakfast.      . furosemide (LASIX) 20 MG tablet Take 1 tablet (20 mg total) by mouth daily. 45 tablet 1  . isosorbide mononitrate (IMDUR) 60 MG 24 hr tablet Take 30 mg by mouth daily.    Marland Kitchen LORazepam (ATIVAN) 0.5 MG tablet Take 0.25 mg by mouth 2 (two) times daily as needed for anxiety.     . lovastatin (MEVACOR) 20 MG tablet TAKE ONE TABLET BY MOUTH EVERY EVENING FOR CHOLESTEROL. 30 tablet 11  . metoprolol tartrate (LOPRESSOR) 25 MG tablet TAKE 1/2 TABLET BY MOUTH TWICE DAILY 90 tablet 3  . Multiple Vitamin (MULTIVITAMIN) capsule Take 1 capsule by mouth daily.      . Multiple Vitamins-Minerals (OCUVITE ADULT 50+ PO) Take 1 capsule by mouth 2 (two) times daily at 10 AM and 5 PM.     . nitroGLYCERIN (NITROSTAT) 0.4 MG SL tablet Place 0.4 mg under the tongue as needed. Up to 3 doses for chest pain    . Vitamin D, Ergocalciferol, (DRISDOL) 50000 UNITS  CAPS TAKE ONE CAPSULE BY MOUTH ONCE A WEEK FOR 12 WEEKS. 8 capsule 0   No current facility-administered medications for this visit.    ROS-  all other systems are reviewed and negative except as per HPI  Physical Exam: Filed Vitals:   10/22/14 1156  BP: 132/90  Pulse: 55  Height: 5\' 4"  (1.626 m)  Weight: 98 lb 6.4 oz (44.634 kg)    GEN- The patient is elderly and frail appearing, alert and oriented x 3 today.   Head- normocephalic, atraumatic Eyes-  Sclera clear, conjunctiva pink Ears- hearing intact Oropharynx- clear Neck- supple,   Lungs- Clear to ausculation bilaterally, normal work of breathing Chest- pacemaker pocket is well healed Heart- Regular rate and rhythm  GI- soft, NT, ND, + BS Extremities- no clubbing, cyanosis, or edema Neuro- strength and sensation are intact  Pacemaker interrogation- reviewed in detail today,  See PACEART report  Assessment and Plan:  1. Tachy/brady syndrome Normal pacemaker function See Pace Art report No changes today  2. Persistent atrial fibrillation CHADS2VASC score of at least 4.  She and her daughter decline anticoagulation   Return to the device clinic to see the device NP in 1 year  Thompson Grayer MD, [facility] 10/22/2014 12:22 PM

## 2014-10-22 NOTE — Patient Instructions (Addendum)
Medication Instructions:  Your physician recommends that you continue on your current medications as directed. Please refer to the Current Medication list given to you today.  Labwork: None ordered  Testing/Procedures: None ordered  Follow-Up: Your physician wants you to follow-up in: 6 months with device clinic.  You will receive a reminder letter in the mail two months in advance. If you don't receive a letter, please call our office to schedule the follow-up appointment.  Your physician wants you to follow-up in: 1 year with Chanetta Marshall, NP. You will receive a reminder letter in the mail two months in advance. If you don't receive a letter, please call our office to schedule the follow-up appointment.  Any Other Special Instructions Will Be Listed Below (If Applicable). Thank you for choosing [facility]!!

## 2014-11-06 ENCOUNTER — Encounter: Payer: Self-pay | Admitting: Internal Medicine

## 2014-12-14 ENCOUNTER — Ambulatory Visit (INDEPENDENT_AMBULATORY_CARE_PROVIDER_SITE_OTHER): Payer: Medicare Other | Admitting: Ophthalmology

## 2015-01-13 DEATH — deceased

## 2015-01-17 ENCOUNTER — Ambulatory Visit: Payer: Medicare Other | Admitting: Cardiology

## 2015-01-23 ENCOUNTER — Ambulatory Visit (INDEPENDENT_AMBULATORY_CARE_PROVIDER_SITE_OTHER): Payer: Medicare Other | Admitting: Ophthalmology
